# Patient Record
Sex: Male | Born: 1965 | Race: White | Hispanic: No | Marital: Married | Smoking: Never smoker
Health system: Southern US, Community
[De-identification: ages and names within clinical notes are randomized; demographics above are authoritative.]

## PROBLEM LIST (undated history)

## (undated) DIAGNOSIS — K219 Gastro-esophageal reflux disease without esophagitis: Secondary | ICD-10-CM

## (undated) HISTORY — PX: OTHER SURGICAL HISTORY: SHX169

## (undated) HISTORY — PX: APPENDECTOMY: SHX54

## (undated) HISTORY — PX: SHOULDER ARTHROSCOPY: SHX128

## (undated) HISTORY — DX: Gastro-esophageal reflux disease without esophagitis: K21.9

---

## 1999-03-16 ENCOUNTER — Ambulatory Visit (HOSPITAL_COMMUNITY): Admission: RE | Admit: 1999-03-16 | Discharge: 1999-03-16 | Payer: Self-pay | Admitting: Gastroenterology

## 2001-07-02 ENCOUNTER — Encounter: Admission: RE | Admit: 2001-07-02 | Discharge: 2001-07-02 | Payer: Self-pay | Admitting: Gastroenterology

## 2001-07-02 ENCOUNTER — Encounter: Payer: Self-pay | Admitting: Gastroenterology

## 2001-07-23 ENCOUNTER — Encounter (INDEPENDENT_AMBULATORY_CARE_PROVIDER_SITE_OTHER): Payer: Self-pay | Admitting: Specialist

## 2001-07-23 ENCOUNTER — Ambulatory Visit (HOSPITAL_COMMUNITY): Admission: RE | Admit: 2001-07-23 | Discharge: 2001-07-23 | Payer: Self-pay | Admitting: *Deleted

## 2001-11-23 ENCOUNTER — Observation Stay (HOSPITAL_COMMUNITY): Admission: EM | Admit: 2001-11-23 | Discharge: 2001-11-24 | Payer: Self-pay

## 2001-11-23 ENCOUNTER — Encounter (INDEPENDENT_AMBULATORY_CARE_PROVIDER_SITE_OTHER): Payer: Self-pay | Admitting: Specialist

## 2001-11-23 ENCOUNTER — Encounter: Payer: Self-pay | Admitting: Emergency Medicine

## 2004-06-15 ENCOUNTER — Ambulatory Visit: Payer: Self-pay | Admitting: Family Medicine

## 2007-09-08 ENCOUNTER — Emergency Department: Payer: Self-pay | Admitting: Unknown Physician Specialty

## 2007-09-10 ENCOUNTER — Ambulatory Visit (HOSPITAL_COMMUNITY): Admission: RE | Admit: 2007-09-10 | Discharge: 2007-09-11 | Payer: Self-pay | Admitting: Orthopedic Surgery

## 2010-02-11 ENCOUNTER — Emergency Department (HOSPITAL_COMMUNITY): Admission: EM | Admit: 2010-02-11 | Discharge: 2010-02-11 | Payer: Self-pay | Admitting: Family Medicine

## 2010-03-03 ENCOUNTER — Encounter: Admission: RE | Admit: 2010-03-03 | Discharge: 2010-03-03 | Payer: Self-pay | Admitting: Surgery

## 2010-03-24 ENCOUNTER — Ambulatory Visit (HOSPITAL_COMMUNITY): Admission: RE | Admit: 2010-03-24 | Discharge: 2010-03-24 | Payer: Self-pay | Admitting: Surgery

## 2010-10-01 LAB — CBC
HCT: 43.6 % (ref 39.0–52.0)
Hemoglobin: 15.2 g/dL (ref 13.0–17.0)
MCV: 90 fL (ref 78.0–100.0)
RDW: 12.3 % (ref 11.5–15.5)
WBC: 8.1 10*3/uL (ref 4.0–10.5)

## 2010-10-01 LAB — COMPREHENSIVE METABOLIC PANEL
ALT: 15 U/L (ref 0–53)
Alkaline Phosphatase: 60 U/L (ref 39–117)
BUN: 11 mg/dL (ref 6–23)
CO2: 25 mEq/L (ref 19–32)
Chloride: 105 mEq/L (ref 96–112)
GFR calc non Af Amer: >60 mL/min (ref >60)
Glucose, Bld: 107 mg/dL — ABNORMAL HIGH (ref 70–99)
Potassium: 3.7 mEq/L (ref 3.5–5.1)
Sodium: 139 mEq/L (ref 135–145)
Total Bilirubin: 0.5 mg/dL (ref 0.3–1.2)

## 2010-10-01 LAB — DIFFERENTIAL
Basophils Absolute: 0 10*3/uL (ref 0.0–0.1)
Basophils Relative: 1 % (ref 0–1)
Eosinophils Absolute: 0.1 10*3/uL (ref 0.0–0.7)
Monocytes Relative: 12 % (ref 3–12)
Neutro Abs: 5.1 10*3/uL (ref 1.7–7.7)
Neutrophils Relative %: 63 % (ref 43–77)

## 2010-11-08 ENCOUNTER — Ambulatory Visit (INDEPENDENT_AMBULATORY_CARE_PROVIDER_SITE_OTHER): Payer: BC Managed Care – PPO

## 2010-11-08 ENCOUNTER — Inpatient Hospital Stay (INDEPENDENT_AMBULATORY_CARE_PROVIDER_SITE_OTHER)
Admission: RE | Admit: 2010-11-08 | Discharge: 2010-11-08 | Disposition: A | Discharge status: Home / Self Care | Payer: BC Managed Care – PPO | Source: Ambulatory Visit | Attending: Family Medicine | Admitting: Family Medicine

## 2010-11-08 DIAGNOSIS — S9030XA Contusion of unspecified foot, initial encounter: Secondary | ICD-10-CM

## 2010-11-29 NOTE — Op Note (Signed)
Dakota [name], [name]              ACCOUNT NO.:  [id number]   MEDICAL RECORD NO.:  [id number]          PATIENT TYPE:  AMB   LOCATION:  SDS                          FACILITY:  [facility]   PHYSICIAN:  Burnard Bunting, M.D.    DATE OF BIRTH:  1965/08/17   DATE OF PROCEDURE:  09/10/2007  DATE OF DISCHARGE:                               OPERATIVE REPORT   PREOPERATIVE DIAGNOSIS:  Left grade V AC joint separation.   POSTOPERATIVE DIAGNOSIS:  Left grade V AC joint separation.   PROCEDURE:  Left grade V AC joint separation coracoclavicular ligament  reconstruction with palmaris longus autograft and graft rope from  Arthrex.   SURGEON:  Burnard Bunting, M.D.   ASSISTANT:  Jerolyn Shin. Tresa Res, M.D.   ANESTHESIA:  General endotracheal.   ESTIMATED BLOOD LOSS:  50 mL.   DRAINS:  None.   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  After explanation of  risks and benefits.   DESCRIPTION OF PROCEDURE:  The patient was brought to the operating room  where general endotracheal anesthesia was induced.  The patient was  initially supine on the operating table.  His right arm was prepped with  DuraPrep solution and draped in a sterile manner.  The tourniquet was  elevated for approximately 15 minutes at 250 mmHg.  A transverse  incision was made just proximal to the wrist flexion crease.  Palmaris  longus tendon was identified and dissected free from the surrounding  structures and transected.  Three transverse incisions were then made up  the forearm until the palmaris longus tendon was harvested.  The tendon  was then fixed to the graft rope.  It was funneled into thirds with two-  thirds on one end and one-third on the other end.  At this time the  tourniquet was released.  Bleeding points encountered were controlled  with electrocautery.  The forearm fascia  was closed with 3-0 Vicryl  suture and the skin was closed with 3-0 nylon suture.  Bulky soft  dressing was placed.  At this time the patient was then lifted upright  in the beach chair position with the head in a neutral position.  The  left arm, shoulder, and hand was prepped with DuraPrep solution and  draped in a sterile manner.  Topographic anatomy of the shoulder was  identified including the distal end of the clavicle which was visible as  a deformity through the skin as well as the coracoid process.  Longitudinal incision was made from the transverse process of the  coracoid up to the top of the clavicle.  Skin and subcutaneous tissue  were sharply divided.  The fascia was divided over the clavicle  longitudinally.  Muscles split bluntly down to the coracoid process.  The conoid tubercle was palpated and visualized.  The medial and lateral  superior aspect of the coracoid process was also visualized.  The  conjoin tendon was split longitudinally and blunt dissection was  used to  dissect underneath the coracoid tubercle.  At this time guide pin was  then drilled with the underlying neurovascular structures protected by  both my finger and the cotton elevator.  Guide pin was drilled  approximately 2 cm posterior to the anterior tip of the coracoid  process.  Wire loop was then passed in the 6 cm tunnel.  In a similar  fashion the clavicle was drilled approximately 40 mm medial to the  distal end of the clavicle.  This was done with a 6 mm drill.  At this  time the graft rope with the tight rope and the palmaris longus graft  was passed.  The endobutton was turned and that was confirmed in the AP  plane under fluoroscopy.  The clavicle was reduced.  The button was  tied.  Interference screw was placed into the tendon ends which came up  proximally through the clavicle.  Secure fixation was achieved.  A  thorough irrigation was performed.  The fascia was closed over the  clavicle  which was now reduced.  The bluntly divided muscle anteriorly  was also closed using #1 Vicryl suture.  Thorough irrigation was  performed prior to closure.  The skin was closed using interrupted  inverted 0 Vicryl suture, 2-0 Vicryl suture, and 3-0 Prolene.  Dressing  and sling were applied.  The patient tolerated the procedure well  without immediate complications.  Dr. Lenny Pastel help was required at  all times during this case because of the difficulty in guide pin  placement and retraction of neurovascular structures.  His assistance  was a medical necessity for safely performing this case.      Burnard Bunting, M.D.  Electronically Signed     GSD/MEDQ  D:  09/10/2007  T:  09/11/2007  Job:  [id number]

## 2010-12-02 NOTE — Procedures (Signed)
[facility]  Patient:    Dakota [name], Dakota [name] Visit Number: [id number] MRN: [id number]          Service Type: END Location: ENDO Attending Physician:  Sabino Gasser Dictated by:   Sabino Gasser, M.D. Admit Date:  07/23/2001   CC:         Anselmo Rod, M.D.                           Procedure Report  PROCEDURE:  Upper endoscopy with dilation and biopsy.  INDICATION:  Dysphagia with lower esophageal web.  ANESTHESIA:  Demerol 100 mg, Versed 10 mg.  DESCRIPTION OF PROCEDURE:  With the patient mildly sedated in the left lateral decubitus position, the Olympus videoscopic endoscope was inserted in the mouth, passed under direct vision through the esophagus, which appeared normal until we reached the distalmost portion, and a web was seen and there were also some changes of possible esophagitis noted and photograph taken.  We entered into the stomach and noted a hiatal hernia sac.  Fundus, body, antrum, duodenal bulb, second portion of duodenum all appeared relatively normal. From this point the endoscope was slowly withdrawn, taking circumferential views of the entire duodenal mucosa until the endoscope had been pulled back in the stomach, placed in retroflexion to view the stomach from below, and the hiatal hernia sac was once again seen from below.  The endoscope was then straightened and withdrawn, taking circumferential views of the remaining gastric and esophageal mucosa.  The endoscope was then reinserted distally to the stomach in the antrum and the guidewire was passed, the endoscope removed, and over the guidewire an 18 Savary dilator was passed easily.  With it the guidewire was removed.  The endoscope was reinserted, and the distalmost esophagus was where there were changes noted, and the possible esophagitis was biopsied.  The patients vital signs and pulse oximetry remained stable.  The patient tolerated the procedure well without apparent  complications.  FINDINGS: 1. 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 2. Esophagitis. 3. Hiatal hernia.  PLAN:  Await biopsy report.  The patient will call me for results and follow up with Dr. Loreta Ave as an outpatient. Dictated by:   Sabino Gasser, M.D. Attending Physician:  Sabino Gasser DD:  07/23/01 TD:  07/23/01 Job: [id number]

## 2010-12-02 NOTE — Op Note (Signed)
[place]  Patient:    Dakota [name], Dakota [name] Visit Number: [id number] MRN: [id number]          Service Type: SUR Location: [id number] Attending Physician:  Delsa Bern Dictated by:   Lorne Skeens. Hoxworth, M.D. Proc. Date: 11/23/01 Admit Date:  11/23/2001 Discharge Date: 11/24/2001                             Operative Report  PREOPERATIVE DIAGNOSIS:  Acute appendicitis.  POSTOPERATIVE DIAGNOSIS:  Acute appendicitis.  PROCEDURE:  Laparoscopic appendectomy.  SURGEON:  Lorne Skeens. Hoxworth, M.D.  ANESTHESIA:  General.  BRIEF HISTORY:  Dakota [name] is a 45 year old white male who presents with two days of gradually worsening right lower quadrant abdominal pain.  CT scan from the emergency room was consistent with early appendicitis.  Laparoscopic appendectomy has been recommended and accepted.  The nature of the procedure, its indications, and the risks of bleeding and infection were discussed and understood preoperatively.  He is now brought to the operating room for this procedure.  DESCRIPTION OF PROCEDURE:  The patient was brought to the operating room and placed in supine position on the operating table, and general endotracheal anesthesia was induced.  The Foley catheter was in place.  He received preoperative antibiotics.  The abdomen was sterilely prepped and draped. Local anesthesia was used to infiltrate the trocar sites.  A 1 cm incision was made at the umbilicus and dissection was carried down to the midline fascia, which was sharply incised for 1 cm and the peritoneum entered under direct vision.  Through a mattress suture of 0 Vicryl, the Hasson trocar was placed and pneumoperitoneum established.  Video laparoscopy showed the appendix to be lying anteriorly.  It was acutely inflamed without gangrene or perforation. Under direct vision a 5 mm trocar was placed in the right upper quadrant and 10/12 mm trocar in  the left lower quadrant.  The appendix was grasped and elevated and the base and mesoappendix exposed.  The appendix was inflamed down to near its base, but the base was soft.  The mesoappendix was dissected away from the appendix at its base.  It was divided with two firings of the Endo-GIA vascular stapler, completely freeing the appendix from the mesentery to the base.  Following this the appendix was divided from the cecum at its base with a firing of the 3.5 mm Endo-GIA stapler.  The appendix was placed in the Endocatch bag and brought out through the umbilicus.  The operative site was irrigated and complete hemostasis assured.  Trocars were removed under direct vision and all CO2 evacuated from the peritoneal cavity.  The mattress suture was secured to the umbilicus.  An additional mattress suture of 0 Vicryl was placed in the fascia in the left lower quadrant trocar site.  Skin incisions were closed with interrupted subcuticular 4-0 Monocryl and Steri-Strips.  Sponge and needle counts were correct.  Dry sterile dressings were applied.  The patient was taken to the recovery room in good condition. Dictated by:   Lorne Skeens. Hoxworth, M.D. Attending Physician:  Delsa Bern DD:  11/23/01 TD:  11/26/01 Job: [id number]

## 2011-04-07 LAB — BASIC METABOLIC PANEL
CO2: 28
Calcium: 9.1
Creatinine, Ser: 0.95
GFR calc Af Amer: >60
GFR calc non Af Amer: >60
Sodium: 139

## 2011-04-07 LAB — TYPE AND SCREEN: Antibody Screen: NEGATIVE

## 2011-04-07 LAB — CBC
MCHC: 34.1
RBC: 4.94
RDW: 12.6

## 2011-04-07 LAB — URINALYSIS, ROUTINE W REFLEX MICROSCOPIC
Bilirubin Urine: NEGATIVE
Hgb urine dipstick: NEGATIVE
Nitrite: NEGATIVE
Protein, ur: NEGATIVE
Urobilinogen, UA: 0.2

## 2011-04-07 LAB — DIFFERENTIAL
Basophils Absolute: 0
Basophils Relative: 1
Lymphocytes Relative: 24
Monocytes Relative: 8
Neutro Abs: 5.6
Neutrophils Relative %: 67

## 2011-04-07 LAB — ABO/RH: ABO/RH(D): O POS

## 2013-04-14 ENCOUNTER — Encounter (HOSPITAL_COMMUNITY): Payer: Self-pay | Admitting: Emergency Medicine

## 2013-04-14 ENCOUNTER — Emergency Department (HOSPITAL_COMMUNITY)
Admission: EM | Admit: 2013-04-14 | Discharge: 2013-04-14 | Disposition: A | Discharge status: Home / Self Care | Payer: 59 | Source: Home / Self Care | Attending: Emergency Medicine | Admitting: Emergency Medicine

## 2013-04-14 ENCOUNTER — Ambulatory Visit (HOSPITAL_COMMUNITY): Payer: 59 | Attending: Emergency Medicine

## 2013-04-14 DIAGNOSIS — S61409A Unspecified open wound of unspecified hand, initial encounter: Secondary | ICD-10-CM | POA: Insufficient documentation

## 2013-04-14 DIAGNOSIS — Z23 Encounter for immunization: Secondary | ICD-10-CM

## 2013-04-14 DIAGNOSIS — X58XXXA Exposure to other specified factors, initial encounter: Secondary | ICD-10-CM | POA: Insufficient documentation

## 2013-04-14 DIAGNOSIS — M7989 Other specified soft tissue disorders: Secondary | ICD-10-CM | POA: Insufficient documentation

## 2013-04-14 DIAGNOSIS — L089 Local infection of the skin and subcutaneous tissue, unspecified: Secondary | ICD-10-CM

## 2013-04-14 MED ORDER — CEPHALEXIN 500 MG PO CAPS: 500.0000 mg | ORAL_CAPSULE | Freq: Four times a day (QID) | ORAL | Status: DC

## 2013-04-14 MED ORDER — TETANUS-DIPHTH-ACELL PERTUSSIS 5-2.5-18.5 LF-MCG/0.5 IM SUSP
0.5000 mL | Freq: Once | INTRAMUSCULAR | Status: AC
  Administered 2013-04-14: 0.5 mL via INTRAMUSCULAR

## 2013-04-14 MED ORDER — TETANUS-DIPHTH-ACELL PERTUSSIS 5-2.5-18.5 LF-MCG/0.5 IM SUSP
INTRAMUSCULAR | Status: AC
  Filled 2013-04-14: qty 0.5

## 2013-04-14 NOTE — ED Notes (Signed)
Patient reports cutting right thumb on a piece of sheet metal on Friday-9/26.  Small, scabbed wound on right thumb.  Also noted swelling, pain and redness in thumb and index finger and area between the two locations

## 2013-04-14 NOTE — ED Notes (Signed)
To main xray

## 2013-04-14 NOTE — ED Provider Notes (Signed)
CSN: [id number]     Arrival date & time 04/14/13  1507 History   None    Chief Complaint  Patient presents with  . Wound Infection   (Consider location/radiation/quality/duration/timing/severity/associated sxs/prior Treatment) HPI Comments: Pt here because thumb and adjacent hand are now swelling, painful.   Patient is a 47 y.o. male presenting with hand injury. The history is provided by the patient.  Hand Injury Location:  Hand Time since incident:  3 days Injury: yes   Mechanism of injury comment:  Cut R thumb on a piece of sheet metal Hand location:  R hand Pain details:    Quality:  Aching and pressure   Radiates to: radiates from R thumb at site of wound to thumb DIP and MCP of index finger.   Severity:  Moderate   Onset quality:  Gradual   Duration:  3 days   Timing:  Constant   Progression:  Worsening Chronicity:  New Foreign body present:  No foreign bodies Tetanus status:  Out of date Relieved by:  Nothing Worsened by:  Nothing tried Ineffective treatments:  None tried Associated symptoms: no fever, no muscle weakness, no numbness and no tingling     History reviewed. No pertinent past medical history. Past Surgical History  Procedure Laterality Date  . Finger reattachment Left   . Shoulder arthroscopy Left    No family history on file. History  Substance Use Topics  . Smoking status: Never Smoker   . Smokeless tobacco: Not on file  . Alcohol Use: No    Review of Systems  Constitutional: Negative for fever and chills.  Skin: Positive for wound.    Allergies  Review of patient's allergies indicates no known allergies.  Home Medications   Current Outpatient Rx  Name  Route  Sig  Dispense  Refill  . cephALEXin (KEFLEX) 500 MG capsule   Oral   Take 1 capsule (500 mg total) by mouth 4 (four) times daily.   40 capsule   0    BP 108/55  Pulse 60  Temp(Src) 98 F (36.7 C) (Oral)  Resp 16  SpO2 100% Physical Exam  Constitutional: He appears  well-developed and well-nourished. No distress.  Musculoskeletal:       Right hand: He exhibits tenderness and swelling. He exhibits normal range of motion. Normal strength noted.       Hands: Tender to palp in thumb, thenar area of R hand and MCP of index finger  Skin: Skin is warm and dry.  See MSK exam    ED Course  Procedures (including critical care time) Labs Review Labs Reviewed - No data to display Imaging Review Dg Hand Complete Right  04/14/2013   CLINICAL DATA:  Laceration.  EXAM: RIGHT HAND - COMPLETE 3+ VIEW  COMPARISON:  None.  FINDINGS: The joint spaces are maintained. No acute fracture or radiopaque foreign body. Soft tissue swelling noted over the dorsum of the hand at the metacarpal head level  IMPRESSION: No fracture or radiopaque foreign body.   Electronically Signed   By: Loralie Champagne M.D.   On: 04/14/2013 18:03    MDM   1. Laceration of hand with infection, right, initial encounter    rx keflex 500mg  QID for 10 days. Referred to [facility] for f/u; pt previously seen at [facility] ortho, will try for f/u there first.     Cathlyn Parsons, NP 04/14/13 1836

## 2013-04-14 NOTE — ED Notes (Signed)
Patient returned from radiology, patient not ready for discharge.  Patient expecting antibiotics

## 2013-04-14 NOTE — ED Notes (Signed)
Tetanus 5-10 years ago

## 2013-04-15 NOTE — ED Provider Notes (Signed)
Medical screening examination/treatment/procedure(s) were performed by resident physician or non-physician practitioner and as supervising physician I was immediately available for consultation/collaboration.   Barkley Bruns MD.   Linna Hoff, MD 04/15/13 [phone]

## 2016-05-29 ENCOUNTER — Ambulatory Visit (HOSPITAL_COMMUNITY)
Admission: EM | Admit: 2016-05-29 | Discharge: 2016-05-29 | Disposition: A | Discharge status: Home / Self Care | Payer: 59 | Attending: Emergency Medicine | Admitting: Emergency Medicine

## 2016-05-29 ENCOUNTER — Encounter (HOSPITAL_COMMUNITY): Payer: Self-pay | Admitting: Emergency Medicine

## 2016-05-29 DIAGNOSIS — L729 Follicular cyst of the skin and subcutaneous tissue, unspecified: Secondary | ICD-10-CM

## 2016-05-29 MED ORDER — MUPIROCIN 2 % EX OINT: 1.0000 "application " | TOPICAL_OINTMENT | Freq: Two times a day (BID) | CUTANEOUS | 0 refills | Status: DC

## 2016-05-29 NOTE — Discharge Instructions (Signed)
You have a cyst on your scalp. We poked a hole in it. Do not pick at it. There is a good chance this will come back. Wash the area with soap and water twice a day and apply mupirocin ointment. Follow-up as needed.

## 2016-05-29 NOTE — ED Triage Notes (Signed)
Pt has a small abscess on the left side of the back of his head.  It is red and sore.  Pt states it is getting bigger over the last month.  He denies any fever.

## 2016-05-29 NOTE — ED Provider Notes (Signed)
[place]    CSN: [id number] Arrival date & time: 05/29/16  1251     History   Chief Complaint Chief Complaint  Patient presents with  . Abscess    HPI Dakota [name] is a 50 y.o. male.   HPI Patient is a 50 y.o. Male who presents to UC c/o abscess on left side of posterior scalp x 3.5 weeks.  Notes that it has been increasing in size, is red and itchy, only mildly tender. Wife has tried to pop without success.  Has never had anything like this in the past.  Denies fever, chills, abdominal pain, N/V/D.    History reviewed. No pertinent past medical history.  There are no active problems to display for this patient.   Past Surgical History:  Procedure Laterality Date  . finger reattachment Left   . SHOULDER ARTHROSCOPY Left        Home Medications    Prior to Admission medications   Medication Sig Start Date End Date Taking? Authorizing Provider  mupirocin ointment (BACTROBAN) 2 % Apply 1 application topically 2 (two) times daily. For 5 days 05/29/16   Melony Overly, MD    Family History History reviewed. No pertinent family history.  Social History Social History  Substance Use Topics  . Smoking status: Never Smoker  . Smokeless tobacco: Never Used  . Alcohol use No     Allergies   Patient has no known allergies.   Review of Systems Review of Systems  Constitutional: Negative for activity change, chills, fatigue and fever.  Skin: Positive for color change.       Erythematous abscess on left side of scalp  All other systems reviewed and are negative.    Physical Exam Triage Vital Signs ED Triage Vitals [05/29/16 1416]  Enc Vitals Group     BP 130/83     Pulse Rate 85     Resp      Temp 98 F (36.7 C)     Temp Source Oral     SpO2 95 %     Weight      Height      Head Circumference      Peak Flow      Pain Score 0     Pain Loc      Pain Edu?      Excl. in [place]?    No data found.   Updated Vital Signs BP 130/83 (BP  Location: Right Arm)   Pulse 85   Temp 98 F (36.7 C) (Oral)   SpO2 95%    Physical Exam  Constitutional: He is oriented to person, place, and time. He appears well-developed and well-nourished. No distress.  HENT:  Head: Normocephalic and atraumatic.  Eyes: EOM are normal.  Neck: Normal range of motion.  Cardiovascular: Normal rate and regular rhythm.   Pulmonary/Chest: Effort normal. No respiratory distress.  Musculoskeletal: Normal range of motion.  Neurological: He is alert and oriented to person, place, and time.  Skin: Skin is warm and dry. He is not diaphoretic.     1 cm x 1 cm erythematous cyst on left side posterior scalp. No warmth, drainage, or pus. No tenderness to palpation.  Psychiatric: He has a normal mood and affect. His behavior is normal. Judgment and thought content normal.  Nursing note and vitals reviewed.    UC Treatments / Results  Labs (all labs ordered are listed, but only abnormal results are displayed) Labs Reviewed - No data  to display  EKG  EKG Interpretation None       Radiology No results found.  Procedures .Marland KitchenIncision and Drainage Date/Time: 05/29/2016 3:03 PM Performed by: Melony Overly Authorized by: Melony Overly   Consent:    Consent obtained:  Verbal   Consent given by:  Patient   Alternatives discussed:  Alternative treatment Location:    Type:  Cyst   Size:  1 cm   Location:  Head   Head location:  Scalp Anesthesia (see MAR for exact dosages):    Anesthesia method:  Local infiltration   Local anesthetic:  Lidocaine 2% WITH epi Procedure type:    Complexity:  Simple Procedure details:    Needle aspiration: no     Incision types:  Single straight   Incision depth:  Dermal   Scalpel blade:  11   Wound management:  Probed and deloculated   Drainage:  Serosanguinous   Drainage amount:  Scant   Wound treatment:  Wound left open   Packing materials:  None Post-procedure details:    Patient tolerance of procedure:   Tolerated well, no immediate complications    (including critical care time)  Medications Ordered in UC Medications - No data to display   Initial Impression / Assessment and Plan / UC Course  I have reviewed the triage vital signs and the nursing notes.  Pertinent labs & imaging results that were available during my care of the patient were reviewed by me and considered in my medical decision making (see chart for details).  Clinical Course   Patient presented c/o abscess on posterior left scalp x 3.5 weeks.   Cystic in appearance.  Gave patient the option of antibiotic treatment v. I&D. Patient preferred I&D. With straight incision, was able to get scant fluid out. Advised that there is a good chance that the cyst will come back.  Left incision open to heal.  Mupirocin ointment 2% bid x 5 days prescribed at today's visit.  Advised not to pick at the cyst, wash with soap and water twice daily and apply mupirocin ointment.  Follow up as needed.  Patient seen and examined with PA student.  Note is reviewed and edited as necessary.  I personally performed the I&D.  Final Clinical Impressions(s) / UC Diagnoses   Final diagnoses:  Scalp cyst    New Prescriptions New Prescriptions   MUPIROCIN OINTMENT (BACTROBAN) 2 %    Apply 1 application topically 2 (two) times daily. For 5 days     Melony Overly, MD 05/29/16 2121

## 2016-07-27 ENCOUNTER — Encounter (HOSPITAL_COMMUNITY): Payer: Self-pay | Admitting: Emergency Medicine

## 2016-07-27 ENCOUNTER — Ambulatory Visit (HOSPITAL_COMMUNITY)
Admission: EM | Admit: 2016-07-27 | Discharge: 2016-07-27 | Disposition: A | Discharge status: Home / Self Care | Payer: 59 | Attending: Emergency Medicine | Admitting: Emergency Medicine

## 2016-07-27 DIAGNOSIS — D2339 Other benign neoplasm of skin of other parts of face: Secondary | ICD-10-CM | POA: Diagnosis not present

## 2016-07-27 MED ORDER — MUPIROCIN 2 % EX OINT: 1.0000 "application " | TOPICAL_OINTMENT | Freq: Two times a day (BID) | CUTANEOUS | 0 refills | Status: DC

## 2016-07-27 NOTE — Discharge Instructions (Signed)
This is likely something called a dermoid cyst. Please call Dr. Allyson Sabal, a dermatologist. He can remove it for you. Use the mupirocin ointment twice a day for the next couple of days.

## 2016-07-27 NOTE — ED Triage Notes (Signed)
Here for abscess onset 2 weeks on forehead  Reports he tried to lancet it w/no success  Sx include: pain   A&O x4... NAD

## 2016-07-27 NOTE — ED Provider Notes (Signed)
[place]    CSN: [id number] Arrival date & time: 07/27/16  1421     History   Chief Complaint Chief Complaint  Patient presents with  . Abscess    HPI Dakota [name] is a 51 y.o. male.   HPI  He is a 51 year old man here for possible abscess on his forehead. It started about 4 days ago. It has gradually been getting larger. His wife tried to drain it with a needle without success. It is uncomfortable. No fevers.  History reviewed. No pertinent past medical history.  There are no active problems to display for this patient.   Past Surgical History:  Procedure Laterality Date  . finger reattachment Left   . SHOULDER ARTHROSCOPY Left        Home Medications    Prior to Admission medications   Medication Sig Start Date End Date Taking? Authorizing Provider  mupirocin ointment (BACTROBAN) 2 % Apply 1 application topically 2 (two) times daily. For 5 days 07/27/16   Melony Overly, MD    Family History History reviewed. No pertinent family history.  Social History Social History  Substance Use Topics  . Smoking status: Never Smoker  . Smokeless tobacco: Never Used  . Alcohol use No     Allergies   Patient has no known allergies.   Review of Systems Review of Systems As in history of present illness  Physical Exam Triage Vital Signs ED Triage Vitals [07/27/16 1441]  Enc Vitals Group     BP 126/70     Pulse Rate 79     Resp 16     Temp 98.1 F (36.7 C)     Temp Source Oral     SpO2 98 %     Weight      Height      Head Circumference      Peak Flow      Pain Score 6     Pain Loc      Pain Edu?      Excl. in [place]?    No data found.   Updated Vital Signs BP 126/70 (BP Location: Left Arm)   Pulse 79   Temp 98.1 F (36.7 C) (Oral)   Resp 16   SpO2 98%   Visual Acuity Right Eye Distance:   Left Eye Distance:   Bilateral Distance:    Right Eye Near:   Left Eye Near:    Bilateral Near:     Physical Exam  Constitutional:  He is oriented to person, place, and time. He appears well-developed and well-nourished. No distress.  Cardiovascular: Normal rate.   Pulmonary/Chest: Effort normal.  Neurological: He is alert and oriented to person, place, and time.  Skin:  1 cm well-defined non-tender cystic nodule on the forehead     UC Treatments / Results  Labs (all labs ordered are listed, but only abnormal results are displayed) Labs Reviewed - No data to display  EKG  EKG Interpretation None       Radiology No results found.  Procedures .Marland KitchenIncision and Drainage Date/Time: 07/27/2016 3:42 PM Performed by: Melony Overly Authorized by: Melony Overly   Consent:    Consent obtained:  Verbal   Consent given by:  Patient   Alternatives discussed:  No treatment Location:    Type:  Cyst   Size:  1cm   Location:  Head   Head/neck location: Forehead. Pre-procedure details:    Procedure prep: Alcohol. Anesthesia (see MAR for  exact dosages):    Anesthesia method:  Local infiltration   Local anesthetic:  Lidocaine 2% WITH epi Post-procedure details:    Patient tolerance of procedure:  Tolerated well, no immediate complications Comments:     An incision was made across the nodule. This appears to be a solid nodule. There was no drainage.   (including critical care time)  Medications Ordered in UC Medications - No data to display   Initial Impression / Assessment and Plan / UC Course  I have reviewed the triage vital signs and the nursing notes.  Pertinent labs & imaging results that were available during my care of the patient were reviewed by me and considered in my medical decision making (see chart for details).  Clinical Course     This appears to be a dermoid cyst. Recommended follow-up with dermatology for removal. Mupirocin ointment twice a day for the next few days.  Final Clinical Impressions(s) / UC Diagnoses   Final diagnoses:  Dermoid cyst of forehead    New  Prescriptions Current Discharge Medication List       Melony Overly, MD 07/27/16 1544

## 2016-08-21 ENCOUNTER — Other Ambulatory Visit: Payer: Self-pay | Admitting: Otolaryngology

## 2017-04-19 ENCOUNTER — Encounter (INDEPENDENT_AMBULATORY_CARE_PROVIDER_SITE_OTHER): Payer: Self-pay | Admitting: Orthopedic Surgery

## 2017-04-19 ENCOUNTER — Ambulatory Visit (INDEPENDENT_AMBULATORY_CARE_PROVIDER_SITE_OTHER): Payer: 59 | Admitting: Orthopedic Surgery

## 2017-04-19 ENCOUNTER — Ambulatory Visit (INDEPENDENT_AMBULATORY_CARE_PROVIDER_SITE_OTHER): Payer: 59

## 2017-04-19 DIAGNOSIS — M25511 Pain in right shoulder: Secondary | ICD-10-CM

## 2017-04-19 DIAGNOSIS — M25512 Pain in left shoulder: Secondary | ICD-10-CM | POA: Diagnosis not present

## 2017-04-19 DIAGNOSIS — G8929 Other chronic pain: Secondary | ICD-10-CM | POA: Diagnosis not present

## 2017-04-19 MED ORDER — MELOXICAM 15 MG PO TABS: ORAL_TABLET | ORAL | 0 refills | Status: DC

## 2017-04-23 NOTE — Progress Notes (Signed)
Office Visit Note   Patient: Dakota [name]           Date of Birth: 1965/12/31           MRN: [id number] Visit Date: 04/19/2017 Requested by: No referring provider defined for this encounter. PCP: Patient, No Pcp Per  Subjective: Chief Complaint  Patient presents with  . Shoulder Pain    bilateral L>R    HPI: Dakota [name] is a 51 year old patient with bilateral shoulder pain left worse than right.  Had grade 5 acromioclavicular joint stabilization 9 years ago.  Reports general's left-sided shoulder pain.  Hurts for him in the deltoid region.  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  Denies any new mechanical symptoms.  He is still working.  Reports similar but less severe right-sided shoulder symptoms.  Denies any neck pain or numbness and tingling.  The pain will wake him from sleep at night.              ROS: All systems reviewed are negative as they relate to the chief complaint within the history of present illness.  Patient denies  fevers or chills.   Assessment & Plan: Visit Diagnoses:  1. Chronic pain of both shoulders     Plan: Impression is bilateral shoulder pain left worse than right plan is ultrasound examination with the large probe and rotator cuff appears to be intact.  We'll try him on Modic for 3 weeks.  4 week return to decide then for or against injection or further imaging.  I also want to compare his left-sided chest x-ray to previous chest x-rays regarding opacity around the seventh and eighth rib on the left-hand side  Follow-Up Instructions: Return in about 4 weeks (around 05/17/2017).   Orders:  Orders Placed This Encounter  Procedures  . XR Shoulder Left  . XR Shoulder Right   Meds ordered this encounter  Medications  . meloxicam (MOBIC) 15 MG tablet    Sig: 1 po q d x 3 weeks    Dispense:  30 tablet    Refill:  0      Procedures: No procedures performed   Clinical Data: No additional  findings.  Objective: Vital Signs: There were no vitals taken for this visit.  Physical Exam:   Constitutional: Patient appears well-developed HEENT:  Head: Normocephalic Eyes:EOM are normal Neck: Normal range of motion Cardiovascular: Normal rate Pulmonary/chest: Effort normal Neurologic: Patient is alert Skin: Skin is warm Psychiatric: Patient has normal mood and affect    Ortho Exam: Orthopedic exam demonstrates well-healed surgical incision left shoulder.  Shoulder girdle with appears symmetric.  Rotator cuff strength intact bilaterally to infraspinatus super space and subscap muscle testing.  No other masses lymph adenopathy or skin changes noted in the shoulder region.  Negative apprehension relocation testing on the left shoulder.  Impingement signs equivocal bilaterally.  O'Brien's testing negative bilaterally.  Specialty Comments:  No specialty comments available.  Imaging: No results found.   PMFS History: There are no active problems to display for this patient.  No past medical history on file.  No family history on file.  Past Surgical History:  Procedure Laterality Date  . finger reattachment Left   . SHOULDER ARTHROSCOPY Left    Social History   Occupational History  . Not on file.   Social History Main Topics  . Smoking status: Never Smoker  . Smokeless tobacco: Never Used  . Alcohol use No  .  Drug use: No  . Sexual activity: Not on file       

## 2017-05-16 ENCOUNTER — Other Ambulatory Visit (INDEPENDENT_AMBULATORY_CARE_PROVIDER_SITE_OTHER): Payer: Self-pay | Admitting: Orthopedic Surgery

## 2017-05-16 NOTE — Telephone Encounter (Signed)
Ok to rf? 

## 2017-05-16 NOTE — Telephone Encounter (Signed)
y

## 2017-05-23 ENCOUNTER — Ambulatory Visit (INDEPENDENT_AMBULATORY_CARE_PROVIDER_SITE_OTHER): Payer: 59 | Admitting: Orthopedic Surgery

## 2017-05-23 ENCOUNTER — Encounter (INDEPENDENT_AMBULATORY_CARE_PROVIDER_SITE_OTHER): Payer: Self-pay | Admitting: Orthopedic Surgery

## 2017-05-23 DIAGNOSIS — M25512 Pain in left shoulder: Secondary | ICD-10-CM | POA: Diagnosis not present

## 2017-05-23 DIAGNOSIS — G8929 Other chronic pain: Secondary | ICD-10-CM | POA: Diagnosis not present

## 2017-05-23 MED ORDER — MELOXICAM 15 MG PO TABS: ORAL_TABLET | ORAL | 0 refills | Status: DC

## 2017-05-23 NOTE — Progress Notes (Signed)
Office Visit Note   Patient: Dakota [name]           Date of Birth: 1965-10-21           MRN: [id number] Visit Date: 05/23/2017 Requested by: No referring provider defined for this encounter. PCP: Patient, No Pcp Per  Subjective: Chief Complaint  Patient presents with  . Shoulder Pain    follow up    HPI: Dakota [name] is a 51 year old patient with bilateral shoulder pain left worse than right.  He has been taking mobile but the pain returned after he stopped taking it.  Reports a general constant burning pain in the deltoid region.  He has had acromioclavicular joint reconstruction years ago done on that left side.  Whenever he has any type of superior pressure sending the humeral head superiorly he reports sharp pain.  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              ROS: All systems reviewed are negative as they relate to the chief complaint within the history of present illness.  Patient denies  fevers or chills.   Assessment & Plan: Visit Diagnoses:  1. Chronic left shoulder pain     Plan: Impression is left shoulder pain refractory to nonoperative management with possibility of rotator cuff pathology or labral pathology based on heavy duty work requirements and current symptoms and exam.  Favor MRI arthrogram to evaluate rotator cuff tear and superior labrum.  We will refill the low back today.  Continue with avoiding heavy overhead lifting.  I'll see him back after his MRI scan  Follow-Up Instructions: Return for after MRI.   Orders:  Orders Placed This Encounter  Procedures  . MR Shoulder Left w/ contrast  . Arthrogram   Meds ordered this encounter  Medications  . meloxicam (MOBIC) 15 MG tablet    Sig: TAKE 1 TABLET BY MOUTH ONCE DAILY X 3 WEEKS    Dispense:  30 tablet    Refill:  0      Procedures: No procedures performed   Clinical Data: No additional findings.  Objective: Vital Signs: There were no vitals taken for this  visit.  Physical Exam:   Constitutional: Patient appears well-developed HEENT:  Head: Normocephalic Eyes:EOM are normal Neck: Normal range of motion Cardiovascular: Normal rate Pulmonary/chest: Effort normal Neurologic: Patient is alert Skin: Skin is warm Psychiatric: Patient has normal mood and affect    Ortho Exam: Orthopedic exam demonstrates full active and passive range of motion of the right shoulder on the left-hand side he has pain with resisted supraspinatus testing but good external rotation strength.  Subscap strength also intact.  Clavicle appears to be normally located in relation to the acromion on both sides.  He does have some pain with internal/external rotation on the left-hand side positive O'Brien's testing on the left.  No restriction of passive range of motion on the left shoulder.  Specialty Comments:  No specialty comments available.  Imaging: No results found.   PMFS History: There are no active problems to display for this patient.  History reviewed. No pertinent past medical history.  History reviewed. No pertinent family history.  Past Surgical History:  Procedure Laterality Date  . finger reattachment Left   . SHOULDER ARTHROSCOPY Left    Social History   Occupational History  . Not on file  Tobacco Use  . Smoking status: Never Smoker  . Smokeless tobacco: Never Used  Substance and Sexual Activity  .  Alcohol use: No  . Drug use: No  . Sexual activity: Not on file

## 2017-06-12 ENCOUNTER — Ambulatory Visit
Admission: RE | Admit: 2017-06-12 | Discharge: 2017-06-12 | Disposition: A | Discharge status: Home / Self Care | Payer: 59 | Source: Ambulatory Visit | Attending: Orthopedic Surgery | Admitting: Orthopedic Surgery

## 2017-06-12 DIAGNOSIS — M25512 Pain in left shoulder: Principal | ICD-10-CM

## 2017-06-12 DIAGNOSIS — G8929 Other chronic pain: Secondary | ICD-10-CM

## 2017-06-12 MED ORDER — IOPAMIDOL (ISOVUE-M 200) INJECTION 41%
20.0000 mL | Freq: Once | INTRAMUSCULAR | Status: AC
  Administered 2017-06-12: 20 mL via INTRA_ARTICULAR

## 2017-06-14 ENCOUNTER — Ambulatory Visit (INDEPENDENT_AMBULATORY_CARE_PROVIDER_SITE_OTHER): Payer: 59 | Admitting: Orthopedic Surgery

## 2017-06-14 DIAGNOSIS — M75122 Complete rotator cuff tear or rupture of left shoulder, not specified as traumatic: Secondary | ICD-10-CM

## 2017-06-17 ENCOUNTER — Encounter (INDEPENDENT_AMBULATORY_CARE_PROVIDER_SITE_OTHER): Payer: Self-pay | Admitting: Orthopedic Surgery

## 2017-06-17 NOTE — Progress Notes (Signed)
   Office Visit Note   Patient: Dakota [name]           Date of Birth: 16-Dec-1965           MRN: [id number] Visit Date: 06/14/2017 Requested by: No referring provider defined for this encounter. PCP: Patient, No Pcp Per  Subjective: Chief Complaint  Patient presents with  . 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  Since I have seen him he has had an MRI scan which shows rotator cuff tear.  The scan is reviewed with the patient.  He is having some symptoms.  Hair is medium size and not retracted.  The biceps tendon may or may not be he is having continued pain and symptoms with did cavities which involved strength.              ROS: All systems reviewed are negative as they relate to the chief complaint within the history of present illness.  Patient denies  fevers or chills.   Assessment & Plan: Visit Diagnoses:  1. Complete tear of left rotator cuff     Plan: Impression is left shoulder rotator cuff tear.  Plan rotator cuff repair and possible biceps tenodesis.  In general the biceps tendon looks intact but occult damage may be present which would necessitate tenodesis.  Anticipate 2-3 months out of work of heavy lifting with that shoulder rotator cuff repair.  The risk and benefits are discussed including but not limited to infection nerve vessel damage healing as well as need for more surgery.  Plan to use postop CPM machine.  All questions answered.  Follow-Up Instructions: No Follow-up on file.   Orders:  No orders of the defined types were placed in this encounter.  No orders of the defined types were placed in this encounter.     Procedures: No procedures performed   Clinical Data: No additional findings.  Objective: Vital Signs: There were no vitals taken for this visit.  Physical Exam:   Constitutional: Patient appears well-developed HEENT:  Head: Normocephalic Eyes:EOM are normal Neck:  Normal range of motion Cardiovascular: Normal rate Pulmonary/chest: Effort normal Neurologic: Patient is alert Skin: Skin is warm Psychiatric: Patient has normal mood and affect    Ortho Exam: Orthopedic exam demonstrates full active and passive range of motion of that left shoulder with some rotator cuff weakness to infraspinatus and supraspinatus testing.  O'Brien's testing is negative.  No discrete acromioclavicular joint tenderness is present on the left or right shoulder.  No restriction of passive range of motion of that left shoulder.  Specialty Comments:  No specialty comments available.  Imaging: No results found.   PMFS History: There are no active problems to display for this patient.  History reviewed. No pertinent past medical history.  History reviewed. No pertinent family history.  Past Surgical History:  Procedure Laterality Date  . finger reattachment Left   . SHOULDER ARTHROSCOPY Left    Social History   Occupational History  . Not on file  Tobacco Use  . Smoking status: Never Smoker  . Smokeless tobacco: Never Used  Substance and Sexual Activity  . Alcohol use: No  . Drug use: No  . Sexual activity: Not on file

## 2017-06-25 ENCOUNTER — Ambulatory Visit: Payer: 59 | Admitting: Family Medicine

## 2017-08-19 ENCOUNTER — Other Ambulatory Visit (INDEPENDENT_AMBULATORY_CARE_PROVIDER_SITE_OTHER): Payer: Self-pay | Admitting: Orthopedic Surgery

## 2017-08-20 NOTE — Telephone Encounter (Signed)
Ok to rf? 

## 2017-08-20 NOTE — Telephone Encounter (Signed)
y

## 2019-05-12 ENCOUNTER — Other Ambulatory Visit: Payer: Self-pay

## 2019-05-12 DIAGNOSIS — Z20822 Contact with and (suspected) exposure to covid-19: Secondary | ICD-10-CM

## 2019-05-13 LAB — NOVEL CORONAVIRUS, NAA: SARS-CoV-2, NAA: NOT DETECTED

## 2019-06-05 ENCOUNTER — Other Ambulatory Visit: Payer: Self-pay

## 2019-06-05 DIAGNOSIS — Z20822 Contact with and (suspected) exposure to covid-19: Secondary | ICD-10-CM

## 2019-06-08 LAB — NOVEL CORONAVIRUS, NAA: SARS-CoV-2, NAA: NOT DETECTED

## 2021-07-20 ENCOUNTER — Ambulatory Visit: Payer: Self-pay

## 2021-11-08 ENCOUNTER — Ambulatory Visit: Payer: 59 | Admitting: Physician Assistant

## 2021-11-08 ENCOUNTER — Ambulatory Visit (INDEPENDENT_AMBULATORY_CARE_PROVIDER_SITE_OTHER): Payer: 59

## 2021-11-08 ENCOUNTER — Encounter: Payer: Self-pay | Admitting: Physician Assistant

## 2021-11-08 VITALS — BP 128/81 | HR 79 | Temp 98.6°F | Resp 18 | Ht 70.0 in | Wt 223.0 lb

## 2021-11-08 DIAGNOSIS — J90 Pleural effusion, not elsewhere classified: Secondary | ICD-10-CM | POA: Diagnosis not present

## 2021-11-08 DIAGNOSIS — E6609 Other obesity due to excess calories: Secondary | ICD-10-CM | POA: Insufficient documentation

## 2021-11-08 DIAGNOSIS — Z125 Encounter for screening for malignant neoplasm of prostate: Secondary | ICD-10-CM

## 2021-11-08 DIAGNOSIS — B001 Herpesviral vesicular dermatitis: Secondary | ICD-10-CM

## 2021-11-08 DIAGNOSIS — Z114 Encounter for screening for human immunodeficiency virus [HIV]: Secondary | ICD-10-CM

## 2021-11-08 DIAGNOSIS — K219 Gastro-esophageal reflux disease without esophagitis: Secondary | ICD-10-CM | POA: Insufficient documentation

## 2021-11-08 DIAGNOSIS — Z1159 Encounter for screening for other viral diseases: Secondary | ICD-10-CM

## 2021-11-08 DIAGNOSIS — E66811 Obesity, class 1: Secondary | ICD-10-CM

## 2021-11-08 DIAGNOSIS — Z6832 Body mass index (BMI) 32.0-32.9, adult: Secondary | ICD-10-CM

## 2021-11-08 MED ORDER — PANTOPRAZOLE SODIUM 40 MG PO TBEC: 40.0000 mg | DELAYED_RELEASE_TABLET | Freq: Every day | ORAL | 2 refills | Status: DC

## 2021-11-08 NOTE — Progress Notes (Signed)
? ?New Patient Office Visit ? ?Subjective   ? ?Patient ID: Dakota [name], male    DOB: [birth year]  Age: 56 y.o. MRN: [id number] ? ?CC:  ?Chief Complaint  ?Patient presents with  ? Referral  ?  Pulmonology  ? ? ?HPI ?Dakota [name] states that he was having lower left back pain and though it may have been kidney stones, does endorse history of that.  States that he went to go see urology, had a CT to rule out kidney stones and learned that he had a pulmonary effusion.  CT report was brought in by patient today  ? ?Impression: ?1.  Tiny nonobstructing lower left renal stone.  No hydronephrosis.  No ureteral or bladder stones. ?2.  Small dependent left pleural effusion with mild to moderate compressive atelectasis in the dependent left lower lobe.  From 10/27/2021 ? ?States today that he does still feel some discomfort in his lower left back, states it is not constant, states that he does feel difficulty breathing when the pain is present.  Does endorse that son was sick with pneumonia recently ? ?States that he has been sleeping in a recliner for the last year due to history of heartburn and acid reflux.  States that he went to the beach last week and slept flat, states that this did help his left lower back pain. ? ?States that it did make his heartburn and acid reflux worse.  States that he takes Tums and omeprazole, but not on a daily basis. ? ?Does state that he was sunburned while at the beach, states that he started getting a cold sore on his lip from the sunburn, has been using over-the-counter cold sore cream with relief. ? ?Denies smoking history ? ? ?Outpatient Encounter Medications as of 11/08/2021  ?Medication Sig  ? pantoprazole (PROTONIX) 40 MG tablet Take 1 tablet (40 mg total) by mouth daily.  ? [DISCONTINUED] meloxicam (MOBIC) 15 MG tablet TAKE 1 TABLET BY MOUTH ONCE DAILY X 3 WEEKS  ? [DISCONTINUED] meloxicam (MOBIC) 15 MG tablet TAKE 1 TABLET BY MOUTH ONCE DAILY X 3 WEEKS  ? [DISCONTINUED]  mupirocin ointment (BACTROBAN) 2 % Apply 1 application topically 2 (two) times daily. For 5 days (Patient not taking: Reported on 04/19/2017)  ? ?No facility-administered encounter medications on file as of 11/08/2021.  ? ? ?History reviewed. No pertinent past medical history. ? ?Past Surgical History:  ?Procedure Laterality Date  ? finger reattachment Left   ? SHOULDER ARTHROSCOPY Left   ? ? ?History reviewed. No pertinent family history. ? ?Social History  ? ?Socioeconomic History  ? Marital status: Married  ?  Spouse name: Not on file  ? Number of children: Not on file  ? Years of education: Not on file  ? Highest education level: Not on file  ?Occupational History  ? Not on file  ?Tobacco Use  ? Smoking status: Never  ? Smokeless tobacco: Never  ?Substance and Sexual Activity  ? Alcohol use: No  ? Drug use: No  ? Sexual activity: Yes  ?Other Topics Concern  ? Not on file  ?Social History Narrative  ? Not on file  ? ?Social Determinants of Health  ? ?Financial Resource Strain: Not on file  ?Food Insecurity: Not on file  ?Transportation Needs: Not on file  ?Physical Activity: Not on file  ?Stress: Not on file  ?Social Connections: Not on file  ?Intimate Partner Violence: Not on file  ? ? ?Review of Systems  ?Constitutional: Negative.   ?  HENT: Negative.    ?Eyes: Negative.   ?Respiratory:  Negative for shortness of breath.   ?Cardiovascular:  Negative for chest pain.  ?Gastrointestinal: Negative.   ?Genitourinary: Negative.   ?Musculoskeletal: Negative.   ?Skin: Negative.   ?Neurological: Negative.   ?Endo/Heme/Allergies: Negative.   ?Psychiatric/Behavioral: Negative.    ? ?  ? ? ?Objective   ? ?BP 128/81 (BP Location: Left Arm, Patient Position: Sitting, Cuff Size: Normal)   Pulse 79   Temp 98.6 ?F (37 ?C) (Oral)   Resp 18   Ht 5\' 10"  (1.778 m)   Wt 223 lb (101.2 kg)   SpO2 95%   BMI 32.00 kg/m?  ? ?Physical Exam ?Vitals and nursing note reviewed.  ?Constitutional:   ?   Appearance: Normal appearance.  ?HENT:   ?   Head: Normocephalic and atraumatic.  ?   Right Ear: External ear normal.  ?   Left Ear: External ear normal.  ?   Nose: Nose normal.  ?   Mouth/Throat:  ?   Mouth: Mucous membranes are moist.  ?   Pharynx: Oropharynx is clear.  ?Eyes:  ?   Extraocular Movements: Extraocular movements intact.  ?   Conjunctiva/sclera: Conjunctivae normal.  ?   Pupils: Pupils are equal, round, and reactive to light.  ?Cardiovascular:  ?   Rate and Rhythm: Normal rate and regular rhythm.  ?   Pulses: Normal pulses.  ?   Heart sounds: Normal heart sounds.  ?Pulmonary:  ?   Effort: Pulmonary effort is normal.  ?   Breath sounds: Examination of the left-lower field reveals decreased breath sounds. Decreased breath sounds present. No wheezing, rhonchi or rales.  ?Musculoskeletal:     ?   General: Normal range of motion.  ?   Cervical back: Normal range of motion and neck supple.  ?Skin: ?   General: Skin is warm and dry.  ?Neurological:  ?   General: No focal deficit present.  ?   Mental Status: He is alert and oriented to person, place, and time.  ?Psychiatric:     ?   Mood and Affect: Mood normal.     ?   Behavior: Behavior normal.     ?   Thought Content: Thought content normal.     ?   Judgment: Judgment normal.  ? ? ?Assessment & Plan:  ? ?Problem List Items Addressed This Visit   ? ?  ? Digestive  ? Gastroesophageal reflux disease without esophagitis  ? Relevant Medications  ? pantoprazole (PROTONIX) 40 MG tablet  ?  ? Other  ? Class 1 obesity due to excess calories with body mass index (BMI) of 32.0 to 32.9 in adult  ? ?Other Visit Diagnoses   ? ? Pleural effusion, left    -  Primary  ? Relevant Orders  ? DG Chest 2 View (Completed)  ? CBC with Differential/Platelet  ? Comp. Metabolic Panel (12)  ? Brain natriuretic peptide  ? Screening PSA (prostate specific antigen)      ? Relevant Orders  ? PSA  ? Screening for HIV (human immunodeficiency virus)      ? Relevant Orders  ? HIV antibody (with reflex)  ? Cold sore      ?  Encounter for HCV screening test for low risk patient      ? Relevant Orders  ? HCV Ab w Reflex to Quant PCR  ? ?  ? ?1. Pleural effusion, left ? ?Impression: ?1.  Tiny nonobstructing lower  left renal stone.  No hydronephrosis.  No ureteral or bladder stones. ?2.  Small dependent left pleural effusion with mild to moderate compressive atelectasis in the dependent left lower lobe.  From 10/27/2021 ? ?Will obtain chest x-ray to see if any improvement since 10/27/2021.  Patient still does have some discomfort, has been 12 days since his CT, patient has vacationed at the beach since then.  ? ?Patient given appointment to establish care Primary Care at [facility] ?- DG Chest 2 View ?- CBC with Differential/Platelet ?- Comp. Metabolic Panel (12) ?- Brain natriuretic peptide ? ?2. Gastroesophageal reflux disease without esophagitis ?Trial Protonix ?- pantoprazole (PROTONIX) 40 MG tablet; Take 1 tablet (40 mg total) by mouth daily.  Dispense: 30 tablet; Refill: 2 ? ?3. Screening PSA (prostate specific antigen) ? ?- PSA ? ?4. Screening for HIV (human immunodeficiency virus) ? ?- HIV antibody (with reflex) ? ?5. Cold sore ?Continue current regimen ? ?6. Encounter for HCV screening test for low risk patient ? ?- HCV Ab w Reflex to Quant PCR ? ?7. Class 1 obesity due to excess calories with body mass index (BMI) of 32.0 to 32.9 in adult, unspecified whether serious comorbidity present ? ? ? ?I have reviewed the patient's medical history (PMH, PSH, Social History, Family History, Medications, and allergies) , and have been updated if relevant. I spent 30 minutes reviewing chart and  face to face time with patient. ? ? ? ?Return for needs PCP here .  ? ?Lamari Youngers S Mayers, PA-C ? ? ?

## 2021-11-08 NOTE — Patient Instructions (Signed)
We will call you with today's results as soon as they are available. ? ?To help with your GERD, I encourage you to do a trial of Protonix once daily. ? ?Kennieth Rad, PA-C ?Physician Assistant ?[place] ?[email or web address] ? ? ?Pleural Effusion ?Pleural effusion is an abnormal buildup of fluid in the layers of tissue between the lungs and the inside of the chest. This space is called the pleural space. The two layers of tissue that line the lungs and the inside of the chest are called pleura. Usually, there is no air in the space between the pleura, only a thin layer of fluid. Some conditions can cause a large amount of fluid to build up, which can cause the lung to collapse if untreated. A pleural effusion is usually caused by another disease that requires treatment. ?What are the causes? ?Pleural effusion can be caused by: ?Conditions that change the pressure in blood vessels, which causes blood and fluid to flow outside their normal areas. These conditions include: ?Partially collapsed lung tissue (atelectasis). ?Heart failure. ?Nephrotic syndrome. This is a kidney condition that causes the body to pass too much protein in urine. ?Peritoneal dialysis. This is a procedure in which the lining of the stomach is used to filter blood as a treatment for kidney failure. ?Liver disease (cirrhosis). ?Fluid buildup caused by damaged tissue or cells in the body. This fluid buildup can be caused by: ?Fluid in the abdomen (ascites). ?Connective tissue diseases, such as lupus or rheumatoid arthritis. ?Certain infections, such as pneumonia or tuberculosis. ?Cancer. ?What are the signs or symptoms? ?In some cases, pleural effusion may cause no symptoms. If symptoms are present, they may include: ?Shortness of breath, especially when lying down. ?Chest pain. This may get worse when taking a deep breath. ?Fever. ?Dry, long-lasting (chronic) cough. ?Fatigue. ?Rapid  breathing. ?Night sweats. ?An underlying condition that is causing the pleural effusion may also cause other symptoms. These conditions include heart failure, pneumonia, blood clots, tuberculosis, or cancer. ?How is this diagnosed? ?This condition may be diagnosed based on: ?Your symptoms and medical history. ?A physical exam. ?A chest X-ray. ?A procedure to use a needle to remove fluid from the pleural space (thoracentesis). This fluid is tested. ?Other imaging studies of the chest, such as an ultrasound or a CT scan. ?How is this treated? ?Depending on the cause of your condition, treatment may include: ?Treating the underlying condition that is causing the pleural effusion. When that condition improves, the effusion will also improve. Examples of treatment for underlying conditions include: ?Antibiotic medicines to treat an infection. ?Diuretics or other heart medicines to treat heart failure. ?Thoracentesis. ?Placing a thin, flexible tube under your skin and into your chest to continuously drain the effusion (indwelling pleural catheter). ?Surgery to remove the outer layer of tissue from the pleural space (decortication). ?A procedure to put medicine into the chest cavity to seal the pleural space and prevent fluid buildup (pleurodesis). ?Chemotherapy and radiation therapy, if your pleural effusion is caused by cancer. These treatments are typically used to kill certain cells in the body. ?Follow these instructions at home: ?Take over-the-counter and prescription medicines only as told by your health care provider. ?Keep track of how long you are able to do mild exercise, such as walking, before you get short of breath. Write down this information to share with your health care provider. Your ability to exercise should improve over time. ?Do not use any products that contain nicotine or tobacco. These products include  cigarettes, chewing tobacco, and vaping devices, such as e-cigarettes. If you need help  quitting, ask your health care provider. ?Return to your normal activities as told by your health care provider. Ask your health care provider what activities are safe for you. ?Keep all follow-up visits. This is important. ?Contact a health care provider if: ?The amount of time that you are able to do mild exercise decreases or does not improve with time. ?You have a fever. ?Get help right away if: ?You are short of breath. ?You develop chest pain. ?You develop a new cough. ?These symptoms may be an emergency. Get help right away. Call 911. ?Do not wait to see if the symptoms will go away. ?Do not drive yourself to the hospital. ?Summary ?Pleural effusion is an abnormal buildup of fluid in the layers of tissue between the lungs and the inside of the chest. ?Pleural effusion can have many causes, including heart failure, pulmonary embolism, infections, or cancer. ?Symptoms of pleural effusion can include shortness of breath, chest pain, fever, long-lasting (chronic) cough, hiccups, or rapid breathing. ?Diagnosis often involves making images of the chest (such as with ultrasound or X-ray) and removing fluid (thoracentesis) to send for testing. ?Treatment for pleural effusion depends on the underlying condition that is causing it. ?This information is not intended to replace advice given to you by your health care provider. Make sure you discuss any questions you have with your health care provider. ?Document Revised: 01/25/2021 Document Reviewed: 01/25/2021 ?Elsevier Patient Education ? [place]. ? ?

## 2021-11-10 ENCOUNTER — Telehealth: Payer: Self-pay | Admitting: Physician Assistant

## 2021-11-10 DIAGNOSIS — J90 Pleural effusion, not elsewhere classified: Secondary | ICD-10-CM

## 2021-11-10 NOTE — Telephone Encounter (Signed)
Copied from [place] [phone]. Topic: General - Other ?>> Nov 10, 2021  9:24 AM Yvette Rack wrote: ?Reason for CRM: Pt called for x-ray results. Pt requests call back at  [phone] ?

## 2021-11-11 ENCOUNTER — Telehealth: Payer: Self-pay | Admitting: Physician Assistant

## 2021-11-11 DIAGNOSIS — J90 Pleural effusion, not elsewhere classified: Secondary | ICD-10-CM

## 2021-11-11 DIAGNOSIS — J189 Pneumonia, unspecified organism: Secondary | ICD-10-CM

## 2021-11-11 LAB — COMP. METABOLIC PANEL (12)
AST: 20 IU/L (ref 0–40)
Albumin/Globulin Ratio: 1.9 (ref 1.2–2.2)
Albumin: 4.4 g/dL (ref 3.8–4.9)
Alkaline Phosphatase: 76 IU/L (ref 44–121)
BUN/Creatinine Ratio: 14 (ref 9–20)
BUN: 18 mg/dL (ref 6–24)
Bilirubin Total: 0.2 mg/dL (ref 0.0–1.2)
Calcium: 9.1 mg/dL (ref 8.7–10.2)
Chloride: 105 mmol/L (ref 96–106)
Creatinine, Ser: 1.28 mg/dL — ABNORMAL HIGH (ref 0.76–1.27)
Globulin, Total: 2.3 g/dL (ref 1.5–4.5)
Glucose: 97 mg/dL (ref 70–99)
Potassium: 4.6 mmol/L (ref 3.5–5.2)
Sodium: 143 mmol/L (ref 134–144)
Total Protein: 6.7 g/dL (ref 6.0–8.5)
eGFR: 65 mL/min/{1.73_m2} (ref >59)

## 2021-11-11 LAB — CBC WITH DIFFERENTIAL/PLATELET

## 2021-11-11 LAB — HCV INTERPRETATION

## 2021-11-11 LAB — HIV ANTIBODY (ROUTINE TESTING W REFLEX): HIV Screen 4th Generation wRfx: NONREACTIVE

## 2021-11-11 LAB — HCV AB W REFLEX TO QUANT PCR: HCV Ab: NONREACTIVE

## 2021-11-11 LAB — PSA: Prostate Specific Ag, Serum: 1 ng/mL (ref 0.0–4.0)

## 2021-11-11 LAB — BRAIN NATRIURETIC PEPTIDE

## 2021-11-11 NOTE — Telephone Encounter (Signed)
-----   Message from Kennieth Rad, [place] sent at 11/10/2021  4:28 PM EDT ----- ?Please call patient and let him know that his chest x-ray did continue to show a small left effusion.  Unfortunately his lab results to rule out possible heart failure or infection were not resulted due to lab error.  Please encourage patient to return to clinic in the morning for repeat testing.  Please inquire of his current symptoms progress. ?

## 2021-11-11 NOTE — Telephone Encounter (Signed)
MA has released future lab in order for [facility] to result patients levels. ?

## 2021-11-11 NOTE — Telephone Encounter (Signed)
Patient verified DOB ?Patient is aware of small effusion still being noted and needing to report to [place] for a repeat BNP. Patient will report today for testing, ?

## 2021-11-11 NOTE — Telephone Encounter (Signed)
Copied from [place] [id number]. Topic: General - Other ?>> Nov 11, 2021 12:43 PM Tessa Lerner A wrote: ?Reason for CRM: The patient has called to request that their lab orders be released or re-submitted to the following facility so that their labs may be completed  ? ?[facility] ?[address]  ?[place], [place] [address].  ?Phone: [phone].  ?Fax: [phone] ? ?Please contact further if needed ?

## 2021-11-12 ENCOUNTER — Other Ambulatory Visit: Payer: Self-pay | Admitting: Physician Assistant

## 2021-11-12 DIAGNOSIS — J90 Pleural effusion, not elsewhere classified: Secondary | ICD-10-CM

## 2021-11-12 LAB — BRAIN NATRIURETIC PEPTIDE: BNP: 11.9 pg/mL (ref 0.0–100.0)

## 2021-11-12 MED ORDER — AZITHROMYCIN 250 MG PO TABS: ORAL_TABLET | ORAL | 0 refills | Status: DC

## 2021-11-12 NOTE — Addendum Note (Signed)
Addended by: Kennieth Rad on: 11/12/2021 03:34 PM ? ? Modules accepted: Orders ? ?

## 2021-11-12 NOTE — Progress Notes (Signed)
error 

## 2021-11-14 ENCOUNTER — Telehealth: Payer: Self-pay | Admitting: *Deleted

## 2021-11-14 NOTE — Telephone Encounter (Signed)
Patient verified DOB ?Patient is aware of results and needing to be treated for the effusion. Patient will pick up medication today and FU with [facility] on 11/21/21 at 3pm. ?

## 2021-11-14 NOTE — Telephone Encounter (Signed)
-----   Message from Kennieth Rad, [place] sent at 11/12/2021  3:34 PM EDT ----- ?Please call patient and let him know that his labs did not show any signs of heart failure.  In order to help resolve the effusion, we will treat for pneumonia, he will take azithromycin.  I do encourage him to schedule a follow-up chest x-ray to ensure resolution.  He does have a follow-up appointment with Gypsy Balsam on Nov 29, 2021.  However please have him follow-up with the mobile team May 8 through May 9 for follow-up.  Prescription sent to pharmacy. ?

## 2021-11-21 ENCOUNTER — Encounter: Payer: Self-pay | Admitting: Physician Assistant

## 2021-11-21 ENCOUNTER — Ambulatory Visit: Payer: 59 | Admitting: Physician Assistant

## 2021-11-21 ENCOUNTER — Ambulatory Visit (INDEPENDENT_AMBULATORY_CARE_PROVIDER_SITE_OTHER): Payer: 59

## 2021-11-21 VITALS — BP 133/79 | HR 67 | Temp 98.7°F | Resp 18 | Ht 70.5 in | Wt 220.0 lb

## 2021-11-21 DIAGNOSIS — J90 Pleural effusion, not elsewhere classified: Secondary | ICD-10-CM

## 2021-11-21 DIAGNOSIS — J189 Pneumonia, unspecified organism: Secondary | ICD-10-CM

## 2021-11-21 NOTE — Progress Notes (Signed)
Patient has eaten today. ?Patient has not taken medication today. ?Patient denies pain at this time. ?

## 2021-11-21 NOTE — Patient Instructions (Signed)
We will call you with today's x-ray result. ? ?Please let us know if there is anything else we can do for you. ? ?Kennieth Rad, PA-C ?Physician Assistant ?[place] ?[email or web address] ? ? ?Health Maintenance, Male ?Adopting a healthy lifestyle and getting preventive care are important in promoting health and wellness. Ask your health care provider about: ?The right schedule for you to have regular tests and exams. ?Things you can do on your own to prevent diseases and keep yourself healthy. ?What should I know about diet, weight, and exercise? ?Eat a healthy diet ? ?Eat a diet that includes plenty of vegetables, fruits, low-fat dairy products, and lean protein. ?Do not eat a lot of foods that are high in solid fats, added sugars, or sodium. ?Maintain a healthy weight ?Body mass index (BMI) is a measurement that can be used to identify possible weight problems. It estimates body fat based on height and weight. Your health care provider can help determine your BMI and help you achieve or maintain a healthy weight. ?Get regular exercise ?Get regular exercise. This is one of the most important things you can do for your health. Most adults should: ?Exercise for at least 150 minutes each week. The exercise should increase your heart rate and make you sweat (moderate-intensity exercise). ?Do strengthening exercises at least twice a week. This is in addition to the moderate-intensity exercise. ?Spend less time sitting. Even light physical activity can be beneficial. ?Watch cholesterol and blood lipids ?Have your blood tested for lipids and cholesterol at 57 years of age, then have this test every 5 years. ?You may need to have your cholesterol levels checked more often if: ?Your lipid or cholesterol levels are high. ?You are older than 57 years of age. ?You are at high risk for heart disease. ?What should I know about cancer screening? ?Many types of cancers can  be detected early and may often be prevented. Depending on your health history and family history, you may need to have cancer screening at various ages. This may include screening for: ?Colorectal cancer. ?Prostate cancer. ?Skin cancer. ?Lung cancer. ?What should I know about heart disease, diabetes, and high blood pressure? ?Blood pressure and heart disease ?High blood pressure causes heart disease and increases the risk of stroke. This is more likely to develop in people who have high blood pressure readings or are overweight. ?Talk with your health care provider about your target blood pressure readings. ?Have your blood pressure checked: ?Every 3-5 years if you are 59-49 years of age. ?Every year if you are 100 years old or older. ?If you are between the ages of 68 and 26 and are a current or former smoker, ask your health care provider if you should have a one-time screening for abdominal aortic aneurysm (AAA). ?Diabetes ?Have regular diabetes screenings. This checks your fasting blood sugar level. Have the screening done: ?Once every three years after age 23 if you are at a normal weight and have a low risk for diabetes. ?More often and at a younger age if you are overweight or have a high risk for diabetes. ?What should I know about preventing infection? ?Hepatitis B ?If you have a higher risk for hepatitis B, you should be screened for this virus. Talk with your health care provider to find out if you are at risk for hepatitis B infection. ?Hepatitis C ?Blood testing is recommended for: ?Everyone born from [birth year] through 1966. ?Anyone with known risk factors for hepatitis C. ?Sexually transmitted  infections (STIs) ?You should be screened each year for STIs, including gonorrhea and chlamydia, if: ?You are sexually active and are younger than 58 years of age. ?You are older than 57 years of age and your health care provider tells you that you are at risk for this type of infection. ?Your sexual activity has  changed since you were last screened, and you are at increased risk for chlamydia or gonorrhea. Ask your health care provider if you are at risk. ?Ask your health care provider about whether you are at high risk for HIV. Your health care provider may recommend a prescription medicine to help prevent HIV infection. If you choose to take medicine to prevent HIV, you should first get tested for HIV. You should then be tested every 3 months for as long as you are taking the medicine. ?Follow these instructions at home: ?Alcohol use ?Do not drink alcohol if your health care provider tells you not to drink. ?If you drink alcohol: ?Limit how much you have to 0-2 drinks a day. ?Know how much alcohol is in your drink. In the [place] one drink equals one 12 oz bottle of beer (355 mL), one 5 oz glass of wine (148 mL), or one 1? oz glass of hard liquor (44 mL). ?Lifestyle ?Do not use any products that contain nicotine or tobacco. These products include cigarettes, chewing tobacco, and vaping devices, such as e-cigarettes. If you need help quitting, ask your health care provider. ?Do not use street drugs. ?Do not share needles. ?Ask your health care provider for help if you need support or information about quitting drugs. ?General instructions ?Schedule regular health, dental, and eye exams. ?Stay current with your vaccines. ?Tell your health care provider if: ?You often feel depressed. ?You have ever been abused or do not feel safe at home. ?Summary ?Adopting a healthy lifestyle and getting preventive care are important in promoting health and wellness. ?Follow your health care provider's instructions about healthy diet, exercising, and getting tested or screened for diseases. ?Follow your health care provider's instructions on monitoring your cholesterol and blood pressure. ?This information is not intended to replace advice given to you by your health care provider. Make sure you discuss any questions you have with your health  care provider. ?Document Revised: 11/22/2020 Document Reviewed: 11/22/2020 ?Elsevier Patient Education ? [employer]. ? ?

## 2021-11-21 NOTE — Progress Notes (Signed)
? ?Established Patient Office Visit ? ?Subjective   ?Patient ID: JOSUE NIGG, male    DOB: Dec 18, 1965  Age: 56 y.o. MRN: [id number] ? ?Chief Complaint  ?Patient presents with  ? Follow-up  ? ? ?States that he feels all of the discomfort that he had in his chest has resolved.  States that he did take the azithromycin as directed, and was working on deep breathing exercises. ? ?No new concerns at this time ? ? ?History reviewed. No pertinent past medical history. ?Social History  ? ?Socioeconomic History  ? Marital status: Married  ?  Spouse name: Not on file  ? Number of children: Not on file  ? Years of education: Not on file  ? Highest education level: Not on file  ?Occupational History  ? Not on file  ?Tobacco Use  ? Smoking status: Never  ? Smokeless tobacco: Never  ?Substance and Sexual Activity  ? Alcohol use: No  ? Drug use: No  ? Sexual activity: Yes  ?Other Topics Concern  ? Not on file  ?Social History Narrative  ? Not on file  ? ?Social Determinants of Health  ? ?Financial Resource Strain: Not on file  ?Food Insecurity: Not on file  ?Transportation Needs: Not on file  ?Physical Activity: Not on file  ?Stress: Not on file  ?Social Connections: Not on file  ?Intimate Partner Violence: Not on file  ? ?History reviewed. No pertinent family history. ?No Known Allergies ?  ? ?Review of Systems  ?Constitutional:  Negative for chills and fever.  ?HENT:  Negative for congestion.   ?Eyes: Negative.   ?Respiratory:  Negative for cough and shortness of breath.   ?Cardiovascular:  Negative for chest pain.  ?Gastrointestinal: Negative.   ?Genitourinary: Negative.   ?Musculoskeletal: Negative.   ?Neurological: Negative.   ?Endo/Heme/Allergies: Negative.   ?Psychiatric/Behavioral: Negative.    ? ?  ?Objective:  ?  ? ?BP 133/79 (BP Location: Left Arm, Patient Position: Sitting, Cuff Size: Normal)   Pulse 67   Temp 98.7 ?F (37.1 ?C) (Oral)   Resp 18   Ht 5' 10.5" (1.791 m)   Wt 220 lb (99.8 kg)   SpO2 98%   BMI  31.12 kg/m?  ? ? ?Physical Exam ?Vitals and nursing note reviewed.  ?Constitutional:   ?   Appearance: Normal appearance.  ?HENT:  ?   Head: Normocephalic and atraumatic.  ?   Right Ear: External ear normal.  ?   Left Ear: External ear normal.  ?   Nose: Nose normal.  ?   Mouth/Throat:  ?   Mouth: Mucous membranes are moist.  ?   Pharynx: Oropharynx is clear.  ?Eyes:  ?   Extraocular Movements: Extraocular movements intact.  ?   Conjunctiva/sclera: Conjunctivae normal.  ?   Pupils: Pupils are equal, round, and reactive to light.  ?Cardiovascular:  ?   Rate and Rhythm: Normal rate and regular rhythm.  ?   Pulses: Normal pulses.  ?   Heart sounds: Normal heart sounds.  ?Pulmonary:  ?   Effort: Pulmonary effort is normal.  ?   Breath sounds: Normal breath sounds. No wheezing.  ?Musculoskeletal:     ?   General: Normal range of motion.  ?   Cervical back: Normal range of motion and neck supple.  ?Skin: ?   General: Skin is warm and dry.  ?Neurological:  ?   General: No focal deficit present.  ?   Mental Status: He is alert and oriented  to person, place, and time.  ?Psychiatric:     ?   Mood and Affect: Mood normal.     ?   Behavior: Behavior normal.     ?   Thought Content: Thought content normal.     ?   Judgment: Judgment normal.  ? ? ? ?  ?Assessment & Plan:  ? ?Problem List Items Addressed This Visit   ?None ? ?1. Pleural effusion, left ?Repeat chest x-ray today.  Patient education given on supportive care. ? ?Patient has appointment to establish care at [facility] at [facility]. ? ?Red flags given for prompt reevaluation ?- DG Chest 2 View ? ? ?I have reviewed the patient's medical history (PMH, PSH, Social History, Family History, Medications, and allergies) , and have been updated if relevant. I spent 20 minutes reviewing chart and  face to face time with patient. ? ? ? ?No follow-ups on file.  ? ? ?Clinton Dragone S Mayers, PA-C ? ?

## 2021-11-23 NOTE — Progress Notes (Deleted)
Patient ID: Dakota [name], male    DOB: Aug 11, 1965  MRN: [id number]  CC: No chief complaint on file.   Subjective: Dakota [name] is a 56 y.o. male who presents for  His concerns today include:  Recent appointment 11/21/2021 with Carrolyn Meiers, PA for pleural effusion left. Repeat chest x-ray ordered. Also, patient education given on supportive care.  Unchanged small left pleural effusion and/or pleural thickening with associated scarring or atelectasis. No new airspace opacity.  Patient Active Problem List   Diagnosis Date Noted   Gastroesophageal reflux disease without esophagitis 11/08/2021   Class 1 obesity due to excess calories with body mass index (BMI) of 32.0 to 32.9 in adult 11/08/2021     Current Outpatient Medications on File Prior to Visit  Medication Sig Dispense Refill   pantoprazole (PROTONIX) 40 MG tablet Take 1 tablet (40 mg total) by mouth daily. 30 tablet 2   No current facility-administered medications on file prior to visit.    No Known Allergies  Social History   Socioeconomic History   Marital status: Married    Spouse name: Not on file   Number of children: Not on file   Years of education: Not on file   Highest education level: Not on file  Occupational History   Not on file  Tobacco Use   Smoking status: Never   Smokeless tobacco: Never  Substance and Sexual Activity   Alcohol use: No   Drug use: No   Sexual activity: Yes  Other Topics Concern   Not on file  Social History Narrative   Not on file   Social Determinants of Health   Financial Resource Strain: Not on file  Food Insecurity: Not on file  Transportation Needs: Not on file  Physical Activity: Not on file  Stress: Not on file  Social Connections: Not on file  Intimate Partner Violence: Not on file    No family history on file.  Past Surgical History:  Procedure Laterality Date   finger reattachment Left    SHOULDER ARTHROSCOPY Left     ROS: Review of  Systems Negative except as stated above  PHYSICAL EXAM: There were no vitals taken for this visit.  Physical Exam  {male adult master:310786} {male adult master:310785}     Latest Ref Rng & Units 11/08/2021    2:12 PM 02/11/2010    7:50 PM 09/10/2007   12:31 PM  CMP  Glucose 70 - 99 mg/dL 97   107   96    BUN 6 - 24 mg/dL '18   11   14    '$ Creatinine 0.76 - 1.27 mg/dL 1.28   1.05   0.95    Sodium 134 - 144 mmol/L 143   139   139    Potassium 3.5 - 5.2 mmol/L 4.6   3.7   3.9    Chloride 96 - 106 mmol/L 105   105   106    CO2 19 - 32 mEq/L  25   28    Calcium 8.7 - 10.2 mg/dL 9.1   9.4   9.1    Total Protein 6.0 - 8.5 g/dL 6.7   7.1     Total Bilirubin 0.0 - 1.2 mg/dL 0.2   0.5     Alkaline Phos 44 - 121 IU/L 76   60     AST 0 - 40 IU/L 20   21     ALT 0 - 53 U/L  15  Lipid Panel  No results found for: CHOL, TRIG, HDL, CHOLHDL, VLDL, LDLCALC, LDLDIRECT  CBC    Component Value Date/Time   WBC CANCELED 11/08/2021 1412   WBC 8.1 02/11/2010 1950   RBC CANCELED 11/08/2021 1412   RBC 4.84 02/11/2010 1950   HGB CANCELED 11/08/2021 1412   HCT CANCELED 11/08/2021 1412   PLT CANCELED 11/08/2021 1412   MCV 90.0 02/11/2010 1950   MCH 31.4 02/11/2010 1950   MCHC 34.9 02/11/2010 1950   RDW 12.3 02/11/2010 1950   LYMPHSABS CANCELED 11/08/2021 1412   MONOABS 1.0 02/11/2010 1950   EOSABS CANCELED 11/08/2021 1412   BASOSABS CANCELED 11/08/2021 1412    ASSESSMENT AND PLAN:  There are no diagnoses linked to this encounter.   Patient was given the opportunity to ask questions.  Patient verbalized understanding of the plan and was able to repeat key elements of the plan. Patient was given clear instructions to go to Emergency Department or return to medical center if symptoms don't improve, worsen, or new problems develop.The patient verbalized understanding.   No orders of the defined types were placed in this encounter.    Requested Prescriptions    No prescriptions  requested or ordered in this encounter    No follow-ups on file.  Camillia Herter, NP

## 2021-11-24 ENCOUNTER — Telehealth: Payer: Self-pay | Admitting: *Deleted

## 2021-11-24 NOTE — Telephone Encounter (Signed)
Medical Assistant left message on patient's home and cell voicemail. Voicemail states to give a call back to [place] with [facility] at [phone] ?Patient is aware of the same effusion being noted on the xray and needing to follow up with pulmonology. ?

## 2021-11-24 NOTE — Addendum Note (Signed)
Addended by: Kennieth Rad on: 11/24/2021 07:39 AM ? ? Modules accepted: Orders ? ?

## 2021-11-24 NOTE — Telephone Encounter (Signed)
-----   Message from Kennieth Rad, PA-C sent at 11/24/2021  7:39 AM EDT ----- ?Although patient did report feeling much improved, chest x-ray still shows unchanged small left pleural effusion and/or pleural thickening.  Referral to pulmonology for further evaluation. ?

## 2021-11-29 ENCOUNTER — Ambulatory Visit: Payer: 59 | Admitting: Family

## 2021-11-29 DIAGNOSIS — J90 Pleural effusion, not elsewhere classified: Secondary | ICD-10-CM

## 2021-12-08 NOTE — Progress Notes (Signed)
Erroneous encounter-disregard

## 2021-12-14 ENCOUNTER — Ambulatory Visit: Payer: 59 | Admitting: Internal Medicine

## 2021-12-14 ENCOUNTER — Encounter: Payer: Self-pay | Admitting: Internal Medicine

## 2021-12-14 VITALS — BP 116/74 | HR 77 | Temp 98.1°F | Ht 70.0 in | Wt 225.0 lb

## 2021-12-14 DIAGNOSIS — J90 Pleural effusion, not elsewhere classified: Secondary | ICD-10-CM

## 2021-12-14 NOTE — Patient Instructions (Addendum)
You can try NSAIDS like aleve or ibuprofen for any residual discomfort you have from the fluid around your lung.   Most likely this was a reactive effusion, likely inflammatory from a previous condition such as kidney stones or trauma.   If your symptoms return, please come back and see me we can try to sample the fluid if there is enough.

## 2021-12-14 NOTE — Progress Notes (Signed)
Dakota [name]    [id number]    1966/01/05  Primary Care Physician:Patient, No Pcp Per (Inactive)  Referring Physician: Kennieth Rad, PA-C [place],  [place] [address] Reason for Consultation: pleural effusion Date of Consultation: 12/14/2021  Chief complaint:   Chief Complaint  Patient presents with   Consult    He had some fluid on his lung. He has some shortness of breath but it has resolved.      HPI: Dakota [name] is a 56 y.o. man who presents for new patient evaluation for pleural effusion.   He had left flank pain for about 2 weeks and thought he had kidney stone in the beginning of May. Urologist evaluation was negative for kidney stones but showed left pleural effusion - urologist sent him to a PCP who recommended 5 days of azithromycin with the PA in primary care. The pain is now resolved, but when tries to take a deep breath in. Never had anything like this before.   Never had pneumonia or  bronchitis before. No fevers, chills, occasional cough.    Social history:  Occupation: works in [employer]: lives at home with wife and three boys, dog and cat.  Smoking history: never smoker, no passive smoke exposure.   Social History   Occupational History   Not on file  Tobacco Use   Smoking status: Never   Smokeless tobacco: Never  Substance and Sexual Activity   Alcohol use: No   Drug use: No   Sexual activity: Yes    Relevant family history:  History reviewed. No pertinent family history.  Past Medical History:  Diagnosis Date   GERD (gastroesophageal reflux disease)     Past Surgical History:  Procedure Laterality Date   finger reattachment Left    SHOULDER ARTHROSCOPY Left      Physical Exam: Blood pressure 116/74, pulse 77, temperature 98.1 F (36.7 C), temperature source Oral, height '5\' 10"'$  (1.778 m), weight 225 lb (102.1 kg), SpO2 96 %. Gen:      No acute distress ENT:  no nasal polyps,  mucus membranes moist Lungs:    No increased respiratory effort, symmetric chest wall excursion, clear to auscultation bilaterally, no wheezes or crackles CV:         Regular rate and rhythm; no murmurs, rubs, or gallops.  No pedal edema Abd:      + bowel sounds; soft, non-tender; no distension MSK: no acute synovitis of DIP or PIP joints, no mechanics hands.  Skin:      Warm and dry; no rashes Neuro: normal speech, no focal facial asymmetry Psych: alert and oriented x3, normal mood and affect   Data Reviewed/Medical Decision Making:  Independent interpretation of tests: Imaging:  Review of patient's chest xrays from April and May 2023 images revealed small pleural effusion in the anterior sulcus of the left hemidiaphragm which seems marginally improved on follow up imaging. The patient's images have been independently reviewed by me.    Labs:  Lab Results  Component Value Date   NA 143 11/08/2021   K 4.6 11/08/2021   CL 105 11/08/2021   CO2 25 02/11/2010   Lab Results  Component Value Date   WBC CANCELED 11/08/2021   HGB CANCELED 11/08/2021   HCT CANCELED 11/08/2021   MCV 90.0 02/11/2010   PLT CANCELED 11/08/2021     Immunization status:  Immunization History  Administered Date(s) Administered   Influenza,inj,Quad PF,6+  Mos 04/11/2019   Influenza-Unspecified 04/16/2021   Tdap 04/14/2013     I reviewed prior external note(s) from primary   I reviewed the result(s) of the labs and imaging as noted above.   I have ordered POC ultrasound   Assessment:  Left pleural effusion, likely reactive  Plan/Recommendations: Most likely this was a reactive pleural effusion, potentially in the context of nephrolithiasis although stone may have passed when he saw urology. Unfortunately our POC ultrasound probe is malfunctioning at the moment but this effusion is so small i'm confident it would be too small for thoracentesis sampling. Recommend expectant management and prn nsaids for  pain control. If symptoms worsen I advised him to return to [place] for repeating imaging.   We discussed disease management and progression at length today.   I spent 40 minutes in the care of this patient today including pre-charting, chart review, review of results, face-to-face care, coordination of care and communication with consultants etc.).    Return to Care: Return if symptoms worsen or fail to improve.  Lenice Llamas, MD Pulmonary and [place]  CC: Mayers, Loraine Grip, [place]

## 2021-12-16 ENCOUNTER — Encounter: Payer: 59 | Admitting: Family

## 2022-02-08 ENCOUNTER — Other Ambulatory Visit: Payer: Self-pay | Admitting: Physician Assistant

## 2022-02-08 DIAGNOSIS — K219 Gastro-esophageal reflux disease without esophagitis: Secondary | ICD-10-CM

## 2022-02-09 NOTE — Telephone Encounter (Signed)
Requested Prescriptions  Pending Prescriptions Disp Refills  . pantoprazole (PROTONIX) 40 MG tablet [Pharmacy Med Name: PANTOPRAZOLE SOD DR 40 MG TAB] 30 tablet 2    Sig: TAKE 1 TABLET BY MOUTH EVERY DAY     Gastroenterology: Proton Pump Inhibitors Passed - 02/08/2022  2:29 AM      Passed - Valid encounter within last 12 months    Recent Outpatient Visits          2 months ago Pleural effusion, left   [facility] [place], PA-C   3 months ago Pleural effusion, left   [facility] at [place], [place]

## 2022-06-07 ENCOUNTER — Ambulatory Visit
Admission: RE | Admit: 2022-06-07 | Discharge: 2022-06-07 | Disposition: A | Discharge status: Home / Self Care | Payer: 59 | Source: Ambulatory Visit | Attending: Internal Medicine | Admitting: Internal Medicine

## 2022-06-07 VITALS — BP 113/62 | HR 70 | Temp 97.8°F | Resp 16

## 2022-06-07 DIAGNOSIS — S60512A Abrasion of left hand, initial encounter: Secondary | ICD-10-CM

## 2022-06-07 MED ORDER — MUPIROCIN 2 % EX OINT: 1.0000 | TOPICAL_OINTMENT | Freq: Two times a day (BID) | CUTANEOUS | 0 refills | Status: DC

## 2022-06-07 NOTE — ED Triage Notes (Signed)
Pt c/o injury to left hand last Friday states he scraped it. Afterwards he "folded the skin back over" and let it heal. Since then the wound is red, swollen, causing joint pain.

## 2022-06-07 NOTE — ED Provider Notes (Signed)
[facility]    CSN: [id number] Arrival date & time: 06/07/22  1350      History   Chief Complaint Chief Complaint  Patient presents with   Hand Problem    Wound on hand that is swelling and red and very painful - Entered by patient    HPI Dakota [name] is a 56 y.o. male.   Patient presents with abrasion to dorsal surface of left hand that occurred about 5 days ago.  Patient states that he was at work and scraped it on a piece of wood.  He states that his skin was pulled back so he placed it back over.  Tetanus vaccine is up-to-date within 5 years per patient.  Patient is concerned given that he is still having some pain in that area.  He denies any purulent drainage.  Denies numbness or tingling.     Past Medical History:  Diagnosis Date   GERD (gastroesophageal reflux disease)     Patient Active Problem List   Diagnosis Date Noted   Gastroesophageal reflux disease without esophagitis 11/08/2021   Class 1 obesity due to excess calories with body mass index (BMI) of 32.0 to 32.9 in adult 11/08/2021    Past Surgical History:  Procedure Laterality Date   finger reattachment Left    SHOULDER ARTHROSCOPY Left        Home Medications    Prior to Admission medications   Medication Sig Start Date End Date Taking? Authorizing Provider  mupirocin ointment (BACTROBAN) 2 % Apply 1 Application topically 2 (two) times daily. 06/07/22  Yes Jake Goodson, Hildred Alamin E, FNP  pantoprazole (PROTONIX) 40 MG tablet TAKE 1 TABLET BY MOUTH EVERY DAY 02/09/22   Mayers, Loraine Grip, PA-C    Family History History reviewed. No pertinent family history.  Social History Social History   Tobacco Use   Smoking status: Never   Smokeless tobacco: Never  Substance Use Topics   Alcohol use: No   Drug use: No     Allergies   Patient has no known allergies.   Review of Systems Review of Systems Per HPI  Physical Exam Triage Vital Signs ED Triage Vitals  Enc Vitals Group      BP 06/07/22 1416 113/62     Pulse Rate 06/07/22 1416 70     Resp 06/07/22 1416 16     Temp 06/07/22 1416 97.8 F (36.6 C)     Temp Source 06/07/22 1416 Oral     SpO2 06/07/22 1416 97 %     Weight --      Height --      Head Circumference --      Peak Flow --      Pain Score 06/07/22 1417 6     Pain Loc --      Pain Edu? --      Excl. in [place]? --    No data found.  Updated Vital Signs BP 113/62 (BP Location: Left Arm)   Pulse 70   Temp 97.8 F (36.6 C) (Oral)   Resp 16   SpO2 97%   Visual Acuity Right Eye Distance:   Left Eye Distance:   Bilateral Distance:    Right Eye Near:   Left Eye Near:    Bilateral Near:     Physical Exam Constitutional:      General: He is not in acute distress.    Appearance: Normal appearance. He is not toxic-appearing or diaphoretic.  HENT:     Head:  Normocephalic and atraumatic.  Eyes:     Extraocular Movements: Extraocular movements intact.     Conjunctiva/sclera: Conjunctivae normal.  Pulmonary:     Effort: Pulmonary effort is normal.  Musculoskeletal:     Comments: Grip strength is 5/5.  Neurovascular intact.  Patient has full range of motion of the hand/fingers.  Skin:    Comments: Patient has a scabbed over abrasion that is approximately 1 cm in diameter present to dorsal surface of left hand between first and second digit.  No bleeding noted.  No purulent drainage.  There is also no swelling.  He does have surrounding erythema with no induration that extends to about 2 cm in diameter including the scabbed over area.  Neurological:     General: No focal deficit present.     Mental Status: He is alert and oriented to person, place, and time. Mental status is at baseline.  Psychiatric:        Mood and Affect: Mood normal.        Behavior: Behavior normal.        Thought Content: Thought content normal.        Judgment: Judgment normal.      UC Treatments / Results  Labs (all labs ordered are listed, but only abnormal results  are displayed) Labs Reviewed - No data to display  EKG   Radiology No results found.  Procedures Procedures (including critical care time)  Medications Ordered in UC Medications - No data to display  Initial Impression / Assessment and Plan / UC Course  I have reviewed the triage vital signs and the nursing notes.  Pertinent labs & imaging results that were available during my care of the patient were reviewed by me and considered in my medical decision making (see chart for details).     Patient has a healing abrasion present to dorsal surface of left hand.  There are no current signs of infection or concern for muscular damage.  Therefore, do not think that imaging is necessary.  Tetanus vaccine is up-to-date per patient.  Will treat with mupirocin topically over the next few days.  Patient was advised to monitor very closely for any signs of infection or worsening pain and to follow-up if these occur.  Patient verbalized understanding and was agreeable with plan. Final Clinical Impressions(s) / UC Diagnoses   Final diagnoses:  Abrasion of left hand, initial encounter     Discharge Instructions      Hand appears to be healing well.  There are no current signs of infection.  I have prescribed prescription antibiotic ointment to apply directly to the area over the next few days.  Please monitor very closely for any increased redness, swelling, pus, increased pain and follow-up with urgent care or primary care if this occurs.    ED Prescriptions     Medication Sig Dispense Auth. Provider   mupirocin ointment (BACTROBAN) 2 % Apply 1 Application topically 2 (two) times daily. 22 g Teodora Medici, [place]      PDMP not reviewed this encounter.   Teodora Medici, [place] 06/07/22 1436

## 2022-06-07 NOTE — Discharge Instructions (Signed)
Hand appears to be healing well.  There are no current signs of infection.  I have prescribed prescription antibiotic ointment to apply directly to the area over the next few days.  Please monitor very closely for any increased redness, swelling, pus, increased pain and follow-up with urgent care or primary care if this occurs.

## 2022-07-16 ENCOUNTER — Emergency Department (HOSPITAL_COMMUNITY): Payer: 59

## 2022-07-16 ENCOUNTER — Encounter (HOSPITAL_COMMUNITY): Admission: EM | Disposition: A | Discharge status: Home / Self Care | Payer: Self-pay | Source: Home / Self Care

## 2022-07-16 ENCOUNTER — Other Ambulatory Visit: Payer: Self-pay

## 2022-07-16 ENCOUNTER — Observation Stay (HOSPITAL_BASED_OUTPATIENT_CLINIC_OR_DEPARTMENT_OTHER): Payer: 59 | Admitting: Anesthesiology

## 2022-07-16 ENCOUNTER — Observation Stay (HOSPITAL_COMMUNITY): Payer: 59 | Admitting: Anesthesiology

## 2022-07-16 ENCOUNTER — Encounter (HOSPITAL_COMMUNITY): Payer: Self-pay | Admitting: Surgery

## 2022-07-16 ENCOUNTER — Inpatient Hospital Stay (HOSPITAL_COMMUNITY)
Admission: EM | Admit: 2022-07-16 | Discharge: 2022-07-19 | DRG: 418 | Disposition: A | Discharge status: Home / Self Care | Payer: 59 | Attending: Surgery | Admitting: Surgery

## 2022-07-16 DIAGNOSIS — K219 Gastro-esophageal reflux disease without esophagitis: Secondary | ICD-10-CM | POA: Diagnosis present

## 2022-07-16 DIAGNOSIS — K828 Other specified diseases of gallbladder: Secondary | ICD-10-CM | POA: Diagnosis present

## 2022-07-16 DIAGNOSIS — Z79899 Other long term (current) drug therapy: Secondary | ICD-10-CM

## 2022-07-16 DIAGNOSIS — K810 Acute cholecystitis: Secondary | ICD-10-CM | POA: Diagnosis not present

## 2022-07-16 DIAGNOSIS — K819 Cholecystitis, unspecified: Secondary | ICD-10-CM | POA: Diagnosis present

## 2022-07-16 DIAGNOSIS — K8000 Calculus of gallbladder with acute cholecystitis without obstruction: Secondary | ICD-10-CM | POA: Diagnosis not present

## 2022-07-16 DIAGNOSIS — K821 Hydrops of gallbladder: Secondary | ICD-10-CM | POA: Diagnosis present

## 2022-07-16 DIAGNOSIS — Z9049 Acquired absence of other specified parts of digestive tract: Secondary | ICD-10-CM

## 2022-07-16 DIAGNOSIS — K838 Other specified diseases of biliary tract: Secondary | ICD-10-CM | POA: Diagnosis present

## 2022-07-16 HISTORY — PX: CHOLECYSTECTOMY: SHX55

## 2022-07-16 LAB — URINALYSIS, ROUTINE W REFLEX MICROSCOPIC
Bilirubin Urine: NEGATIVE
Glucose, UA: NEGATIVE mg/dL
Hgb urine dipstick: NEGATIVE
Ketones, ur: NEGATIVE mg/dL
Leukocytes,Ua: NEGATIVE
Nitrite: NEGATIVE
Protein, ur: NEGATIVE mg/dL
Specific Gravity, Urine: 1.026 (ref 1.005–1.030)
pH: 5 (ref 5.0–8.0)

## 2022-07-16 LAB — COMPREHENSIVE METABOLIC PANEL
ALT: 32 U/L (ref 0–44)
AST: 48 U/L — ABNORMAL HIGH (ref 15–41)
Albumin: 3.3 g/dL — ABNORMAL LOW (ref 3.5–5.0)
Alkaline Phosphatase: 73 U/L (ref 38–126)
Anion gap: 13 (ref 5–15)
BUN: 16 mg/dL (ref 6–20)
CO2: 26 mmol/L (ref 22–32)
Calcium: 9.3 mg/dL (ref 8.9–10.3)
Chloride: 97 mmol/L — ABNORMAL LOW (ref 98–111)
Creatinine, Ser: 1.33 mg/dL — ABNORMAL HIGH (ref 0.61–1.24)
GFR, Estimated: >60 mL/min (ref >60)
Glucose, Bld: 120 mg/dL — ABNORMAL HIGH (ref 70–99)
Potassium: 3.8 mmol/L (ref 3.5–5.1)
Sodium: 136 mmol/L (ref 135–145)
Total Bilirubin: 0.7 mg/dL (ref 0.3–1.2)
Total Protein: 6.9 g/dL (ref 6.5–8.1)

## 2022-07-16 LAB — CBC WITH DIFFERENTIAL/PLATELET
Abs Immature Granulocytes: 0.08 10*3/uL — ABNORMAL HIGH (ref 0.00–0.07)
Basophils Absolute: 0.1 10*3/uL (ref 0.0–0.1)
Basophils Relative: 1 %
Eosinophils Absolute: 0.2 10*3/uL (ref 0.0–0.5)
Eosinophils Relative: 2 %
HCT: 37.8 % — ABNORMAL LOW (ref 39.0–52.0)
Hemoglobin: 12.5 g/dL — ABNORMAL LOW (ref 13.0–17.0)
Immature Granulocytes: 1 %
Lymphocytes Relative: 11 %
Lymphs Abs: 1.4 10*3/uL (ref 0.7–4.0)
MCH: 29.6 pg (ref 26.0–34.0)
MCHC: 33.1 g/dL (ref 30.0–36.0)
MCV: 89.4 fL (ref 80.0–100.0)
Monocytes Absolute: 1.4 10*3/uL — ABNORMAL HIGH (ref 0.1–1.0)
Monocytes Relative: 10 %
Neutro Abs: 10.2 10*3/uL — ABNORMAL HIGH (ref 1.7–7.7)
Neutrophils Relative %: 75 %
Platelets: 508 10*3/uL — ABNORMAL HIGH (ref 150–400)
RBC: 4.23 MIL/uL (ref 4.22–5.81)
RDW: 11.5 % (ref 11.5–15.5)
WBC: 13.3 10*3/uL — ABNORMAL HIGH (ref 4.0–10.5)
nRBC: 0 % (ref 0.0–0.2)

## 2022-07-16 LAB — LIPASE, BLOOD: Lipase: 30 U/L (ref 11–51)

## 2022-07-16 SURGERY — LAPAROSCOPIC CHOLECYSTECTOMY
Anesthesia: General | Site: Abdomen

## 2022-07-16 MED ORDER — ONDANSETRON 4 MG PO TBDP
4.0000 mg | ORAL_TABLET | Freq: Four times a day (QID) | ORAL | Status: DC | PRN
  Filled 2022-07-16: qty 1

## 2022-07-16 MED ORDER — ONDANSETRON HCL 4 MG/2ML IJ SOLN
INTRAMUSCULAR | Status: DC | PRN
  Administered 2022-07-16: 4 mg via INTRAVENOUS

## 2022-07-16 MED ORDER — OXYCODONE HCL 5 MG PO TABS
5.0000 mg | ORAL_TABLET | ORAL | Status: DC | PRN
  Administered 2022-07-16 – 2022-07-18 (×4): 5 mg via ORAL
  Filled 2022-07-16 (×4): qty 1

## 2022-07-16 MED ORDER — AMISULPRIDE (ANTIEMETIC) 5 MG/2ML IV SOLN
INTRAVENOUS | Status: AC
  Filled 2022-07-16: qty 4

## 2022-07-16 MED ORDER — BUPIVACAINE HCL 0.25 % IJ SOLN
INTRAMUSCULAR | Status: DC | PRN
  Administered 2022-07-16: 10 mL

## 2022-07-16 MED ORDER — HYDROMORPHONE HCL 1 MG/ML IJ SOLN
1.0000 mg | Freq: Once | INTRAMUSCULAR | Status: AC
  Administered 2022-07-16: 1 mg via INTRAVENOUS
  Filled 2022-07-16: qty 1

## 2022-07-16 MED ORDER — DIPHENHYDRAMINE HCL 25 MG PO CAPS: 25.0000 mg | ORAL_CAPSULE | Freq: Four times a day (QID) | ORAL | Status: DC | PRN

## 2022-07-16 MED ORDER — CHLORHEXIDINE GLUCONATE 0.12 % MT SOLN: 15.0000 mL | Freq: Once | OROMUCOSAL | Status: AC

## 2022-07-16 MED ORDER — AMISULPRIDE (ANTIEMETIC) 5 MG/2ML IV SOLN
10.0000 mg | Freq: Once | INTRAVENOUS | Status: AC
  Administered 2022-07-16: 10 mg via INTRAVENOUS

## 2022-07-16 MED ORDER — STERILE WATER FOR IRRIGATION IR SOLN
Status: DC | PRN
  Administered 2022-07-16: 1000 mL

## 2022-07-16 MED ORDER — FENTANYL CITRATE (PF) 100 MCG/2ML IJ SOLN
INTRAMUSCULAR | Status: AC
  Filled 2022-07-16: qty 2

## 2022-07-16 MED ORDER — PHENYLEPHRINE 80 MCG/ML (10ML) SYRINGE FOR IV PUSH (FOR BLOOD PRESSURE SUPPORT)
PREFILLED_SYRINGE | INTRAVENOUS | Status: DC | PRN
  Administered 2022-07-16: 160 ug via INTRAVENOUS

## 2022-07-16 MED ORDER — LIDOCAINE 2% (20 MG/ML) 5 ML SYRINGE
INTRAMUSCULAR | Status: AC
  Filled 2022-07-16: qty 5

## 2022-07-16 MED ORDER — LACTATED RINGERS IV SOLN: INTRAVENOUS | Status: DC

## 2022-07-16 MED ORDER — SUGAMMADEX SODIUM 200 MG/2ML IV SOLN
INTRAVENOUS | Status: DC | PRN
  Administered 2022-07-16: 200 mg via INTRAVENOUS

## 2022-07-16 MED ORDER — ORAL CARE MOUTH RINSE: 15.0000 mL | Freq: Once | OROMUCOSAL | Status: AC

## 2022-07-16 MED ORDER — PIPERACILLIN-TAZOBACTAM 3.375 G IVPB
3.3750 g | Freq: Three times a day (TID) | INTRAVENOUS | Status: AC
  Administered 2022-07-16: 3.375 g via INTRAVENOUS
  Filled 2022-07-16: qty 50

## 2022-07-16 MED ORDER — OXYCODONE-ACETAMINOPHEN 5-325 MG PO TABS
1.0000 | ORAL_TABLET | Freq: Once | ORAL | Status: AC
  Administered 2022-07-16: 1 via ORAL
  Filled 2022-07-16: qty 1

## 2022-07-16 MED ORDER — ONDANSETRON 4 MG PO TBDP: 4.0000 mg | ORAL_TABLET | Freq: Once | ORAL | Status: DC

## 2022-07-16 MED ORDER — ROCURONIUM BROMIDE 10 MG/ML (PF) SYRINGE
PREFILLED_SYRINGE | INTRAVENOUS | Status: DC | PRN
  Administered 2022-07-16: 60 mg via INTRAVENOUS
  Administered 2022-07-16: 15 mg via INTRAVENOUS

## 2022-07-16 MED ORDER — HYDROMORPHONE HCL 1 MG/ML IJ SOLN
0.5000 mg | INTRAMUSCULAR | Status: DC | PRN
  Administered 2022-07-16 – 2022-07-18 (×3): 0.5 mg via INTRAVENOUS
  Filled 2022-07-16 (×2): qty 0.5

## 2022-07-16 MED ORDER — ROCURONIUM BROMIDE 10 MG/ML (PF) SYRINGE
PREFILLED_SYRINGE | INTRAVENOUS | Status: AC
  Filled 2022-07-16: qty 10

## 2022-07-16 MED ORDER — PROPOFOL 10 MG/ML IV BOLUS
INTRAVENOUS | Status: AC
  Filled 2022-07-16: qty 20

## 2022-07-16 MED ORDER — SODIUM CHLORIDE 0.9 % IR SOLN
Status: DC | PRN
  Administered 2022-07-16: 1000 mL

## 2022-07-16 MED ORDER — MIDAZOLAM HCL 2 MG/2ML IJ SOLN
INTRAMUSCULAR | Status: AC
  Filled 2022-07-16: qty 2

## 2022-07-16 MED ORDER — FENTANYL CITRATE (PF) 250 MCG/5ML IJ SOLN
INTRAMUSCULAR | Status: DC | PRN
  Administered 2022-07-16: 100 ug via INTRAVENOUS
  Administered 2022-07-16: 50 ug via INTRAVENOUS

## 2022-07-16 MED ORDER — CHLORHEXIDINE GLUCONATE 0.12 % MT SOLN
OROMUCOSAL | Status: AC
  Administered 2022-07-16: 15 mL via OROMUCOSAL
  Filled 2022-07-16: qty 15

## 2022-07-16 MED ORDER — PROPOFOL 10 MG/ML IV BOLUS
INTRAVENOUS | Status: DC | PRN
  Administered 2022-07-16: 170 mg via INTRAVENOUS
  Administered 2022-07-16: 20 mg via INTRAVENOUS

## 2022-07-16 MED ORDER — MIDAZOLAM HCL 2 MG/2ML IJ SOLN
INTRAMUSCULAR | Status: DC | PRN
  Administered 2022-07-16: 2 mg via INTRAVENOUS

## 2022-07-16 MED ORDER — HYDROMORPHONE HCL 1 MG/ML IJ SOLN
INTRAMUSCULAR | Status: AC
  Filled 2022-07-16: qty 1

## 2022-07-16 MED ORDER — HYDROMORPHONE HCL 1 MG/ML IJ SOLN: 0.5000 mg | INTRAMUSCULAR | Status: DC | PRN

## 2022-07-16 MED ORDER — ACETAMINOPHEN 10 MG/ML IV SOLN
INTRAVENOUS | Status: AC
  Filled 2022-07-16: qty 100

## 2022-07-16 MED ORDER — SODIUM CHLORIDE 0.9 % IV SOLN: 2.0000 g | INTRAVENOUS | Status: DC

## 2022-07-16 MED ORDER — DEXAMETHASONE SODIUM PHOSPHATE 10 MG/ML IJ SOLN
INTRAMUSCULAR | Status: DC | PRN
  Administered 2022-07-16: 5 mg via INTRAVENOUS

## 2022-07-16 MED ORDER — ONDANSETRON 4 MG PO TBDP
4.0000 mg | ORAL_TABLET | Freq: Once | ORAL | Status: AC
  Administered 2022-07-16: 4 mg via ORAL
  Filled 2022-07-16: qty 1

## 2022-07-16 MED ORDER — ONDANSETRON HCL 4 MG/2ML IJ SOLN
4.0000 mg | Freq: Once | INTRAMUSCULAR | Status: AC
  Administered 2022-07-16: 4 mg via INTRAVENOUS
  Filled 2022-07-16: qty 2

## 2022-07-16 MED ORDER — METHOCARBAMOL 500 MG PO TABS
500.0000 mg | ORAL_TABLET | Freq: Four times a day (QID) | ORAL | Status: DC
  Administered 2022-07-16 – 2022-07-19 (×9): 500 mg via ORAL
  Filled 2022-07-16 (×9): qty 1

## 2022-07-16 MED ORDER — SODIUM CHLORIDE 0.9 % IV BOLUS
1000.0000 mL | Freq: Once | INTRAVENOUS | Status: AC
  Administered 2022-07-16: 1000 mL via INTRAVENOUS

## 2022-07-16 MED ORDER — ACETAMINOPHEN 10 MG/ML IV SOLN
1000.0000 mg | Freq: Once | INTRAVENOUS | Status: DC | PRN
  Administered 2022-07-16: 1000 mg via INTRAVENOUS

## 2022-07-16 MED ORDER — DIPHENHYDRAMINE HCL 50 MG/ML IJ SOLN: 25.0000 mg | Freq: Four times a day (QID) | INTRAMUSCULAR | Status: DC | PRN

## 2022-07-16 MED ORDER — ACETAMINOPHEN 325 MG PO TABS
650.0000 mg | ORAL_TABLET | Freq: Four times a day (QID) | ORAL | Status: DC
  Administered 2022-07-17 – 2022-07-19 (×8): 650 mg via ORAL
  Filled 2022-07-16 (×9): qty 2

## 2022-07-16 MED ORDER — IOHEXOL 350 MG/ML SOLN
75.0000 mL | Freq: Once | INTRAVENOUS | Status: AC | PRN
  Administered 2022-07-16: 75 mL via INTRAVENOUS

## 2022-07-16 MED ORDER — CHLORHEXIDINE GLUCONATE CLOTH 2 % EX PADS
6.0000 | MEDICATED_PAD | Freq: Once | CUTANEOUS | Status: AC
  Administered 2022-07-16: 6 via TOPICAL

## 2022-07-16 MED ORDER — ENOXAPARIN SODIUM 40 MG/0.4ML IJ SOSY
40.0000 mg | PREFILLED_SYRINGE | Freq: Every day | INTRAMUSCULAR | Status: DC
  Administered 2022-07-17: 40 mg via SUBCUTANEOUS
  Filled 2022-07-16 (×2): qty 0.4

## 2022-07-16 MED ORDER — ONDANSETRON HCL 4 MG/2ML IJ SOLN: 4.0000 mg | Freq: Four times a day (QID) | INTRAMUSCULAR | Status: DC | PRN

## 2022-07-16 MED ORDER — FENTANYL CITRATE (PF) 100 MCG/2ML IJ SOLN
25.0000 ug | INTRAMUSCULAR | Status: DC | PRN
  Administered 2022-07-16 (×3): 50 ug via INTRAVENOUS

## 2022-07-16 MED ORDER — DEXAMETHASONE SODIUM PHOSPHATE 10 MG/ML IJ SOLN
INTRAMUSCULAR | Status: AC
  Filled 2022-07-16: qty 1

## 2022-07-16 MED ORDER — 0.9 % SODIUM CHLORIDE (POUR BTL) OPTIME
TOPICAL | Status: DC | PRN
  Administered 2022-07-16: 1000 mL

## 2022-07-16 MED ORDER — PIPERACILLIN-TAZOBACTAM 3.375 G IVPB
3.3750 g | Freq: Three times a day (TID) | INTRAVENOUS | Status: DC
  Administered 2022-07-16: 3.375 g via INTRAVENOUS
  Filled 2022-07-16: qty 50

## 2022-07-16 MED ORDER — PHENYLEPHRINE HCL-NACL 20-0.9 MG/250ML-% IV SOLN: INTRAVENOUS | Status: DC | PRN

## 2022-07-16 MED ORDER — FENTANYL CITRATE (PF) 250 MCG/5ML IJ SOLN
INTRAMUSCULAR | Status: AC
  Filled 2022-07-16: qty 5

## 2022-07-16 MED ORDER — ONDANSETRON HCL 4 MG/2ML IJ SOLN
INTRAMUSCULAR | Status: AC
  Filled 2022-07-16: qty 2

## 2022-07-16 MED ORDER — LIDOCAINE 2% (20 MG/ML) 5 ML SYRINGE
INTRAMUSCULAR | Status: DC | PRN
  Administered 2022-07-16: 80 mg via INTRAVENOUS

## 2022-07-16 MED ORDER — BUPIVACAINE HCL (PF) 0.25 % IJ SOLN
INTRAMUSCULAR | Status: AC
  Filled 2022-07-16: qty 30

## 2022-07-16 MED ORDER — OXYCODONE HCL 5 MG PO TABS
ORAL_TABLET | ORAL | Status: AC
  Administered 2022-07-16: 5 mg
  Filled 2022-07-16: qty 1

## 2022-07-16 SURGICAL SUPPLY — 46 items
ADH SKN CLS APL DERMABOND .7 (GAUZE/BANDAGES/DRESSINGS) ×1
APL PRP STRL LF DISP 70% ISPRP (MISCELLANEOUS) ×1
APPLIER CLIP 5 13 M/L LIGAMAX5 (MISCELLANEOUS) ×1
APR CLP MED LRG 5 ANG JAW (MISCELLANEOUS) ×1
BAG COUNTER SPONGE SURGICOUNT (BAG) ×1 IMPLANT
BAG SPEC RTRVL 10 TROC 200 (ENDOMECHANICALS) ×1
BAG SPNG CNTER NS LX DISP (BAG) ×1
BIOPATCH RED 1 DISK 7.0 (GAUZE/BANDAGES/DRESSINGS) IMPLANT
BLADE CLIPPER SURG (BLADE) IMPLANT
CANISTER SUCT 3000ML PPV (MISCELLANEOUS) ×1 IMPLANT
CHLORAPREP W/TINT 26 (MISCELLANEOUS) ×1 IMPLANT
CLIP APPLIE 5 13 M/L LIGAMAX5 (MISCELLANEOUS) ×1 IMPLANT
COVER SURGICAL LIGHT HANDLE (MISCELLANEOUS) ×1 IMPLANT
DERMABOND ADVANCED .7 DNX12 (GAUZE/BANDAGES/DRESSINGS) ×1 IMPLANT
DRAIN CHANNEL 19F RND (DRAIN) IMPLANT
DRSG TEGADERM 4X4.75 (GAUZE/BANDAGES/DRESSINGS) IMPLANT
ELECT REM PT RETURN 9FT ADLT (ELECTROSURGICAL) ×1
ELECTRODE REM PT RTRN 9FT ADLT (ELECTROSURGICAL) ×1 IMPLANT
EVACUATOR SILICONE 100CC (DRAIN) IMPLANT
GLOVE BIOGEL PI IND STRL 6 (GLOVE) ×1 IMPLANT
GLOVE BIOGEL PI MICRO STRL 5.5 (GLOVE) ×1 IMPLANT
GOWN STRL REUS W/ TWL LRG LVL3 (GOWN DISPOSABLE) ×3 IMPLANT
GOWN STRL REUS W/TWL LRG LVL3 (GOWN DISPOSABLE) ×3
KIT BASIN OR (CUSTOM PROCEDURE TRAY) ×1 IMPLANT
KIT TURNOVER KIT B (KITS) ×1 IMPLANT
L-HOOK LAP DISP 36CM (ELECTROSURGICAL) ×1
LHOOK LAP DISP 36CM (ELECTROSURGICAL) ×1 IMPLANT
NS IRRIG 1000ML POUR BTL (IV SOLUTION) ×1 IMPLANT
PAD ARMBOARD 7.5X6 YLW CONV (MISCELLANEOUS) ×1 IMPLANT
PENCIL BUTTON HOLSTER BLD 10FT (ELECTRODE) ×1 IMPLANT
POUCH RETRIEVAL ECOSAC 10 (ENDOMECHANICALS) IMPLANT
POUCH RETRIEVAL ECOSAC 10MM (ENDOMECHANICALS) ×1
SCISSORS LAP 5X35 DISP (ENDOMECHANICALS) ×1 IMPLANT
SET IRRIG TUBING LAPAROSCOPIC (IRRIGATION / IRRIGATOR) ×1 IMPLANT
SET TUBE SMOKE EVAC HIGH FLOW (TUBING) ×1 IMPLANT
SLEEVE ENDOPATH XCEL 5M (ENDOMECHANICALS) ×2 IMPLANT
SUT ETHILON 2 0 FS 18 (SUTURE) IMPLANT
SUT MNCRL AB 4-0 PS2 18 (SUTURE) ×1 IMPLANT
SUT VICRYL 0 UR6 27IN ABS (SUTURE) IMPLANT
TOWEL GREEN STERILE (TOWEL DISPOSABLE) ×1 IMPLANT
TOWEL GREEN STERILE FF (TOWEL DISPOSABLE) ×1 IMPLANT
TRAY LAPAROSCOPIC MC (CUSTOM PROCEDURE TRAY) ×1 IMPLANT
TROCAR XCEL BLUNT TIP 100MML (ENDOMECHANICALS) ×1 IMPLANT
TROCAR Z-THREAD OPTICAL 5X100M (TROCAR) ×1 IMPLANT
WARMER LAPAROSCOPE (MISCELLANEOUS) ×1 IMPLANT
WATER STERILE IRR 1000ML POUR (IV SOLUTION) ×1 IMPLANT

## 2022-07-16 NOTE — H&P (Signed)
Dakota [name] 03-Jan-1966  [id number].    Requesting MD: Dakota Bi, PA-C Chief Complaint/Reason for Consult: acute cholecystitis  HPI:  Dakota [name] is a 56 yo male who presented to the ED with acute onset abdominal pain, nausea and vomiting. About a week ago he began having nausea, vomiting and diarrhea that was attributed to food poisoning. This continued for several days and was starting to get better, however yesterday he started having severe epigastric abdominal pain. This worsened and he has had vomiting and diarrhea all night. He also endorses subjective chills. Labs in the ED show a mild leukocytosis. LFTs and lipase are normal. A CT scan showed cholecystitis. A RUQ [place] was then done and also showed cholecystitis with sludge in the gallbladder. General surgery was consulted.  He is overall in good health. His only prior abdominal surgery is a lap appendectomy about 10-15 years ago.  ROS: Review of Systems  Constitutional:  Positive for chills. Negative for fever.  Respiratory:  Negative for shortness of breath.   Gastrointestinal:  Positive for abdominal pain, diarrhea, nausea and vomiting.  Neurological:  Negative for loss of consciousness and weakness.    No family history on file.  Past Medical History:  Diagnosis Date   GERD (gastroesophageal reflux disease)     Past Surgical History:  Procedure Laterality Date   finger reattachment Left    SHOULDER ARTHROSCOPY Left     Social History:  reports that he has never smoked. He has never used smokeless tobacco. He reports that he does not drink alcohol and does not use drugs.  Allergies: No Known Allergies  (Not in a hospital admission)    Physical Exam: Blood pressure 135/83, pulse 92, temperature 98 F (36.7 C), resp. rate 18, height '5\' 10"'$  (1.778 m), weight 88.5 kg, SpO2 99 %. General: resting in bed, appears uncomfortable Neurological: alert and oriented, no focal deficits HEENT: normocephalic,  atraumatic CV: regular rate and rhythm Respiratory: normal work of breathing on room air, symmetric chest wall expansion Abdomen: soft, nondistended, tender to palpation in the epigastric area. Extremities: warm and well-perfused Psychiatric: normal mood and affect Skin: warm and dry, no jaundice, no rashes or lesions   Results for orders placed or performed during the hospital encounter of 07/16/22 (from the past 48 hour(s))  CBC with Differential     Status: Abnormal   Collection Time: 07/16/22  2:10 AM  Result Value Ref Range   WBC 13.3 (H) 4.0 - 10.5 K/uL   RBC 4.23 4.22 - 5.81 MIL/uL   Hemoglobin 12.5 (L) 13.0 - 17.0 g/dL   HCT 37.8 (L) 39.0 - 52.0 %   MCV 89.4 80.0 - 100.0 fL   MCH 29.6 26.0 - 34.0 pg   MCHC 33.1 30.0 - 36.0 g/dL   RDW 11.5 11.5 - 15.5 %   Platelets 508 (H) 150 - 400 K/uL   nRBC 0.0 0.0 - 0.2 %   Neutrophils Relative % 75 %   Neutro Abs 10.2 (H) 1.7 - 7.7 K/uL   Lymphocytes Relative 11 %   Lymphs Abs 1.4 0.7 - 4.0 K/uL   Monocytes Relative 10 %   Monocytes Absolute 1.4 (H) 0.1 - 1.0 K/uL   Eosinophils Relative 2 %   Eosinophils Absolute 0.2 0.0 - 0.5 K/uL   Basophils Relative 1 %   Basophils Absolute 0.1 0.0 - 0.1 K/uL   Immature Granulocytes 1 %   Abs Immature Granulocytes 0.08 (H) 0.00 - 0.07 K/uL  Comment: Performed at [facility], [place] [address] [place], [place] [address]  Comprehensive metabolic panel     Status: Abnormal   Collection Time: 07/16/22  2:10 AM  Result Value Ref Range   Sodium 136 135 - 145 mmol/L   Potassium 3.8 3.5 - 5.1 mmol/L   Chloride 97 (L) 98 - 111 mmol/L   CO2 26 22 - 32 mmol/L   Glucose, Bld 120 (H) 70 - 99 mg/dL    Comment: Glucose reference range applies only to samples taken after fasting for at least 8 hours.   BUN 16 6 - 20 mg/dL   Creatinine, Ser 1.33 (H) 0.61 - 1.24 mg/dL   Calcium 9.3 8.9 - 10.3 mg/dL   Total Protein 6.9 6.5 - 8.1 g/dL   Albumin 3.3 (L) 3.5 - 5.0 g/dL   AST 48 (H) 15 - 41 U/L    ALT 32 0 - 44 U/L   Alkaline Phosphatase 73 38 - 126 U/L   Total Bilirubin 0.7 0.3 - 1.2 mg/dL   GFR, Estimated >60 >60 mL/min    Comment: (NOTE) Calculated using the CKD-EPI Creatinine Equation (2021)    Anion gap 13 5 - 15    Comment: Performed at [facility], [place] [address], [place], [place] [address]  Lipase, blood     Status: None   Collection Time: 07/16/22  2:10 AM  Result Value Ref Range   Lipase 30 11 - 51 U/L    Comment: Performed at [facility] [address], [place], [place] [address]  Urinalysis, Routine w reflex microscopic Urine, Clean Catch     Status: Abnormal   Collection Time: 07/16/22  2:12 AM  Result Value Ref Range   Color, Urine YELLOW YELLOW   APPearance HAZY (A) CLEAR   Specific Gravity, Urine 1.026 1.005 - 1.030   pH 5.0 5.0 - 8.0   Glucose, UA NEGATIVE NEGATIVE mg/dL   Hgb urine dipstick NEGATIVE NEGATIVE   Bilirubin Urine NEGATIVE NEGATIVE   Ketones, ur NEGATIVE NEGATIVE mg/dL   Protein, ur NEGATIVE NEGATIVE mg/dL   Nitrite NEGATIVE NEGATIVE   Leukocytes,Ua NEGATIVE NEGATIVE    Comment: Performed at [facility] [address] [place], [place] [address]   US Abdomen Limited RUQ (LIVER/GB)  Result Date: 07/16/2022 CLINICAL DATA:  56 year old male with abnormal gallbladder on CT Abdomen and Pelvis 0424 hours today. EXAM: ULTRASOUND ABDOMEN LIMITED RIGHT UPPER QUADRANT COMPARISON:  CT Abdomen and Pelvis 0424 hours today. FINDINGS: Gallbladder: Gallbladder sludge and wall thickening ranging from 4 mm to 6 mm. Positive sonographic Murphy sign. No pericholecystic fluid. No obstructing gallstone identified. Common bile duct: Diameter: 4 mm, normal. Liver: No focal lesion identified. Within normal limits in parenchymal echogenicity. Portal vein is patent on color Doppler imaging with normal direction of blood flow towards the liver. Other: Negative visible right kidney.  No free fluid. IMPRESSION: 1. Acute Cholecystitis with sludge.  Consider acalculous cholecystitis or occult obstructing gallstone. 2. No evidence of bile duct obstruction. Electronically Signed   By: Genevie Ann M.D.   On: 07/16/2022 07:39   CT ABDOMEN PELVIS W CONTRAST  Result Date: 07/16/2022 CLINICAL DATA:  Nausea, vomiting and diarrhea. EXAM: CT ABDOMEN AND PELVIS WITH CONTRAST TECHNIQUE: Multidetector CT imaging of the abdomen and pelvis was performed using the standard protocol following bolus administration of intravenous contrast. RADIATION DOSE REDUCTION: This exam was performed according to the departmental dose-optimization program which includes automated exposure control, adjustment  of the mA and/or kV according to patient size and/or use of iterative reconstruction technique. CONTRAST:  64m OMNIPAQUE IOHEXOL 350 MG/ML SOLN COMPARISON:  October 27, 2021 FINDINGS: Lower chest: No acute abnormality. Hepatobiliary: No focal liver abnormality is seen. The gallbladder is moderately distended with mild to moderate severity gallbladder wall thickening and pericholecystic inflammatory fat stranding. No gallstones are clearly identified. There is no evidence of biliary dilatation. Pancreas: Unremarkable. No pancreatic ductal dilatation or surrounding inflammatory changes. Spleen: Normal in size without focal abnormality. Adrenals/Urinary Tract: Adrenal glands are unremarkable. Kidneys are normal in size, without renal calculi or hydronephrosis. Stable bilateral parenchymal and parapelvic renal cysts are seen. Bladder is unremarkable. Stomach/Bowel: There is a small hiatal hernia. The appendix is surgically absent. No evidence of bowel wall thickening, distention, or inflammatory changes. Vascular/Lymphatic: No significant vascular findings are present. No enlarged abdominal or pelvic lymph nodes. Reproductive: Prostate is unremarkable. Other: No abdominal wall hernia or abnormality. No abdominopelvic ascites. Musculoskeletal: Degenerative changes are seen within the lumbar  spine. This is most prominent at the levels of L3-L4 and L5-S1. IMPRESSION: 1. Findings consistent with acute cholecystitis. 2. Stable bilateral parenchymal and parapelvic renal cysts. No follow-up imaging is recommended. This recommendation follows ACR consensus guidelines: Management of the Incidental Renal Mass on CT: A White Paper of the ACR Incidental Findings Committee. J Am Coll Radiol [phone] 3. Small hiatal hernia. Electronically Signed   By: TVirgina NorfolkM.D.   On: 07/16/2022 04:42      Assessment/Plan 56yo male presenting with acute epigastric pain, nausea and vomiting. I personally reviewed his labs, notes and imaging. CT and [place] show pericholecystic fluid, consistent with acute cholecystitis. I recommended laparoscopic cholecystectomy and reviewed the procedure details with the patient, including the risks of bleeding, infection, and <0.5% risk of common bile duct injury. He expressed understanding and agrees to proceed. - NPO, IV fluid hydration - Zosyn given in ED - Pain and nausea control - OR today for laparoscopic cholecystectomy - VTE: lovenox, SCDs - Dispo: admit to observation, med-surg floor   SMichaelle Birks MEast [facility], Hepatobiliary and Pancreatic Surgery 07/16/22 8:21 AM

## 2022-07-16 NOTE — Anesthesia Postprocedure Evaluation (Signed)
Anesthesia Post Note  Patient: Dakota [name]  Procedure(s) Performed: LAPAROSCOPIC CHOLECYSTECTOMY (Abdomen)     Patient location during evaluation: PACU Anesthesia Type: General Level of consciousness: awake and alert Pain management: pain level controlled Vital Signs Assessment: post-procedure vital signs reviewed and stable Respiratory status: spontaneous breathing, nonlabored ventilation, respiratory function stable and patient connected to nasal cannula oxygen Cardiovascular status: blood pressure returned to baseline and stable Postop Assessment: no apparent nausea or vomiting Anesthetic complications: no   No notable events documented.  Last Vitals:  Vitals:   07/16/22 1645 07/16/22 1800  BP: 111/73 114/78  Pulse: 82 78  Resp: 15   Temp: 37 C 36.8 C  SpO2: 97% 98%    Last Pain:  Vitals:   07/16/22 1800  TempSrc:   PainSc: 5                  Paula Busenbark P Minta Fair

## 2022-07-16 NOTE — Transfer of Care (Signed)
Immediate Anesthesia Transfer of Care Note  Patient: Dakota [name]  Procedure(s) Performed: LAPAROSCOPIC CHOLECYSTECTOMY (Abdomen)  Patient Location: PACU  Anesthesia Type:General  Level of Consciousness: awake, drowsy, and patient cooperative  Airway & Oxygen Therapy: Patient Spontanous Breathing  Post-op Assessment: Report given to RN, Post -op Vital signs reviewed and stable, and Patient moving all extremities X 4  Post vital signs: Reviewed and stable  Last Vitals:  Vitals Value Taken Time  BP 113/77 07/16/22 1438  Temp    Pulse 82 07/16/22 1441  Resp 27 07/16/22 1441  SpO2 91 % 07/16/22 1441  Vitals shown include unvalidated device data.  Last Pain:  Vitals:   07/16/22 1224  TempSrc: Oral  PainSc:          Complications: No notable events documented.

## 2022-07-16 NOTE — Anesthesia Procedure Notes (Signed)
Procedure Name: Intubation Date/Time: 07/16/2022 1:14 PM  Performed by: Rande Brunt, CRNAPre-anesthesia Checklist: Patient identified, Emergency Drugs available, Suction available and Patient being monitored Patient Re-evaluated:Patient Re-evaluated prior to induction Oxygen Delivery Method: Circle System Utilized Preoxygenation: Pre-oxygenation with 100% oxygen Induction Type: IV induction Ventilation: Mask ventilation without difficulty Laryngoscope Size: Mac and 4 Grade View: Grade I Tube type: Oral Tube size: 7.5 mm Number of attempts: 1 Airway Equipment and Method: Stylet Placement Confirmation: ETT inserted through vocal cords under direct vision, positive ETCO2 and breath sounds checked- equal and bilateral Secured at: 22 cm Tube secured with: Tape Dental Injury: Teeth and Oropharynx as per pre-operative assessment

## 2022-07-16 NOTE — ED Provider Notes (Signed)
[facility] EMERGENCY DEPARTMENT Provider Note   CSN: [id number] Arrival date & time: 07/16/22  0045     History  Chief Complaint  Patient presents with   Abdominal Pain   Nausea    Dakota [name] is a 56 y.o. male with medical history of GERD.  Patient presents to ED for evaluation of right upper quadrant abdominal pain.  Patient reports that beginning 1 week ago after consuming Brendolyn Patty, he developed bloating, diarrhea and generalized abdominal discomfort.  Patient reports that this waxed and waned throughout the week and resolved at one point however returned last night.  Patient reports that pain last night was worse than it ever been.  The patient states that the pain is now isolated to his right upper quadrant.  Patient complaining of nausea, vomiting and diarrhea.  Patient denies fevers.  Patient denies consuming food or water since midnight.   Abdominal Pain Associated symptoms: diarrhea, nausea and vomiting   Associated symptoms: no fever        Home Medications Prior to Admission medications   Medication Sig Start Date End Date Taking? Authorizing Provider  mupirocin ointment (BACTROBAN) 2 % Apply 1 Application topically 2 (two) times daily. 06/07/22   Teodora Medici, FNP  pantoprazole (PROTONIX) 40 MG tablet TAKE 1 TABLET BY MOUTH EVERY DAY 02/09/22   Mayers, Cari S, PA-C      Allergies    Patient has no known allergies.    Review of Systems   Review of Systems  Constitutional:  Negative for fever.  Gastrointestinal:  Positive for abdominal pain, diarrhea, nausea and vomiting.  All other systems reviewed and are negative.   Physical Exam Updated Vital Signs BP 108/71   Pulse 90   Temp 97.7 F (36.5 C) (Oral)   Resp 18   Ht '5\' 10"'$  (1.778 m)   Wt 88.5 kg   SpO2 93%   BMI 27.98 kg/m  Physical Exam Vitals and nursing note reviewed.  Constitutional:      General: He is not in acute distress.    Appearance: He is ill-appearing. He  is not toxic-appearing or diaphoretic.  HENT:     Head: Normocephalic and atraumatic.     Nose: Nose normal.     Mouth/Throat:     Mouth: Mucous membranes are moist.  Cardiovascular:     Rate and Rhythm: Normal rate and regular rhythm.  Pulmonary:     Effort: Pulmonary effort is normal.     Breath sounds: Normal breath sounds. No wheezing.  Abdominal:     Tenderness: There is abdominal tenderness in the right upper quadrant.  Musculoskeletal:     Cervical back: Normal range of motion and neck supple.  Skin:    General: Skin is warm and dry.     Capillary Refill: Capillary refill takes less than 2 seconds.  Neurological:     Mental Status: He is alert and oriented to person, place, and time.     ED Results / Procedures / Treatments   Labs (all labs ordered are listed, but only abnormal results are displayed) Labs Reviewed  CBC WITH DIFFERENTIAL/PLATELET - Abnormal; Notable for the following components:      Result Value   WBC 13.3 (*)    Hemoglobin 12.5 (*)    HCT 37.8 (*)    Platelets 508 (*)    Neutro Abs 10.2 (*)    Monocytes Absolute 1.4 (*)    Abs Immature Granulocytes 0.08 (*)  All other components within normal limits  COMPREHENSIVE METABOLIC PANEL - Abnormal; Notable for the following components:   Chloride 97 (*)    Glucose, Bld 120 (*)    Creatinine, Ser 1.33 (*)    Albumin 3.3 (*)    AST 48 (*)    All other components within normal limits  URINALYSIS, ROUTINE W REFLEX MICROSCOPIC - Abnormal; Notable for the following components:   APPearance HAZY (*)    All other components within normal limits  LIPASE, BLOOD    EKG None  Radiology US Abdomen Limited RUQ (LIVER/GB)  Result Date: 07/16/2022 CLINICAL DATA:  56 year old male with abnormal gallbladder on CT Abdomen and Pelvis 0424 hours today. EXAM: ULTRASOUND ABDOMEN LIMITED RIGHT UPPER QUADRANT COMPARISON:  CT Abdomen and Pelvis 0424 hours today. FINDINGS: Gallbladder: Gallbladder sludge and wall  thickening ranging from 4 mm to 6 mm. Positive sonographic Murphy sign. No pericholecystic fluid. No obstructing gallstone identified. Common bile duct: Diameter: 4 mm, normal. Liver: No focal lesion identified. Within normal limits in parenchymal echogenicity. Portal vein is patent on color Doppler imaging with normal direction of blood flow towards the liver. Other: Negative visible right kidney.  No free fluid. IMPRESSION: 1. Acute Cholecystitis with sludge. Consider acalculous cholecystitis or occult obstructing gallstone. 2. No evidence of bile duct obstruction. Electronically Signed   By: Genevie Ann M.D.   On: 07/16/2022 07:39   CT ABDOMEN PELVIS W CONTRAST  Result Date: 07/16/2022 CLINICAL DATA:  Nausea, vomiting and diarrhea. EXAM: CT ABDOMEN AND PELVIS WITH CONTRAST TECHNIQUE: Multidetector CT imaging of the abdomen and pelvis was performed using the standard protocol following bolus administration of intravenous contrast. RADIATION DOSE REDUCTION: This exam was performed according to the departmental dose-optimization program which includes automated exposure control, adjustment of the mA and/or kV according to patient size and/or use of iterative reconstruction technique. CONTRAST:  38m OMNIPAQUE IOHEXOL 350 MG/ML SOLN COMPARISON:  October 27, 2021 FINDINGS: Lower chest: No acute abnormality. Hepatobiliary: No focal liver abnormality is seen. The gallbladder is moderately distended with mild to moderate severity gallbladder wall thickening and pericholecystic inflammatory fat stranding. No gallstones are clearly identified. There is no evidence of biliary dilatation. Pancreas: Unremarkable. No pancreatic ductal dilatation or surrounding inflammatory changes. Spleen: Normal in size without focal abnormality. Adrenals/Urinary Tract: Adrenal glands are unremarkable. Kidneys are normal in size, without renal calculi or hydronephrosis. Stable bilateral parenchymal and parapelvic renal cysts are seen. Bladder  is unremarkable. Stomach/Bowel: There is a small hiatal hernia. The appendix is surgically absent. No evidence of bowel wall thickening, distention, or inflammatory changes. Vascular/Lymphatic: No significant vascular findings are present. No enlarged abdominal or pelvic lymph nodes. Reproductive: Prostate is unremarkable. Other: No abdominal wall hernia or abnormality. No abdominopelvic ascites. Musculoskeletal: Degenerative changes are seen within the lumbar spine. This is most prominent at the levels of L3-L4 and L5-S1. IMPRESSION: 1. Findings consistent with acute cholecystitis. 2. Stable bilateral parenchymal and parapelvic renal cysts. No follow-up imaging is recommended. This recommendation follows ACR consensus guidelines: Management of the Incidental Renal Mass on CT: A White Paper of the ACR Incidental Findings Committee. J Am Coll Radiol [id number] 3. Small hiatal hernia. Electronically Signed   By: TVirgina NorfolkM.D.   On: 07/16/2022 04:42    Procedures Procedures   Medications Ordered in ED Medications  Chlorhexidine Gluconate Cloth 2 % PADS 6 each (has no administration in time range)  enoxaparin (LOVENOX) injection 40 mg (has no administration in time range)  lactated ringers infusion (  has no administration in time range)  HYDROmorphone (DILAUDID) injection 0.5 mg (has no administration in time range)  diphenhydrAMINE (BENADRYL) capsule 25 mg (has no administration in time range)    Or  diphenhydrAMINE (BENADRYL) injection 25 mg (has no administration in time range)  ondansetron (ZOFRAN-ODT) disintegrating tablet 4 mg (has no administration in time range)    Or  ondansetron (ZOFRAN) injection 4 mg (has no administration in time range)  sodium chloride 0.9 % bolus 1,000 mL (has no administration in time range)  oxyCODONE-acetaminophen (PERCOCET/ROXICET) 5-325 MG per tablet 1 tablet (1 tablet Oral Given 07/16/22 0221)  ondansetron (ZOFRAN-ODT) disintegrating tablet 4 mg (4  mg Oral Given 07/16/22 0221)  iohexol (OMNIPAQUE) 350 MG/ML injection 75 mL (75 mLs Intravenous Contrast Given 07/16/22 0424)  ondansetron (ZOFRAN) injection 4 mg (4 mg Intravenous Given 07/16/22 0749)  HYDROmorphone (DILAUDID) injection 1 mg (1 mg Intravenous Given 07/16/22 0753)    ED Course/ Medical Decision Making/ A&P Clinical Course as of 07/16/22 2751  [facility] Jul 16, 2022  0444 Radiology called with CT read c/w acute cholecystitis. [KH]    Clinical Course User Index [KH] Antonietta Breach, PA-C                           Medical Decision Making  56 year old male presents to ED for evaluation of abdominal pain.  Please see HPI for further details.  On my examination patient found lying right lateral recumbent.  Patient alert and oriented.  The patient is afebrile and nontachycardic.  The patient lung sounds are clear bilaterally.  The patient abdomen has tenderness in the right upper quadrant.  Patient workup initiated in triage includes CBC, CMP, urinalysis, lipase, CT abdomen pelvis.  CBC with slight leukocytosis to 13.3.  The patient hemoglobin stable.  The patient CMP shows elevated creatinine 1.3 however this is consistent with a baseline creatinine collected a months ago.  The patient urinalysis is unremarkable.  The patient lipase is not elevated.  Patient CT abdomen pelvis shows findings consistent with cholecystitis.  Right upper quadrant ultrasound ordered at this time which shows an acute cholecystitis with sludge.  Patient provided 1 mg Dilaudid for pain control.  Zosyn ordered.  At this time general surgery was consulted.  Dr. Zenia Resides has returned my call.  Dr. Zenia Resides has agreed to admit the patient for further management.  Final Clinical Impression(s) / ED Diagnoses Final diagnoses:  Acute cholecystitis    Rx / DC Orders ED Discharge Orders     None         Azucena Cecil, PA-C 07/16/22 7001    Pattricia Boss, MD 07/16/22 [phone]

## 2022-07-16 NOTE — ED Provider Triage Note (Signed)
Emergency Medicine Provider Triage Evaluation Note  Dakota [name] , a 56 y.o. male  was evaluated in triage.  Pt complains of abdominal pain that has been waxing and waning in severity. Onset around midnight. Describes pain as stabbing and squeezing. Associated with nausea and vomiting. Previously had been experiencing N/V/D x 4 days, but felt like these symptoms were improving until acute worsening this evening. As an aside, reports accidentally ingesting some metal shavings when eating at [employer] on 07/04/22. Hx of appendectomy.  Review of Systems  Positive: As above Negative: As above  Physical Exam  BP 134/81 (BP Location: Right Arm)   Pulse 81   Temp 98.4 F (36.9 C)   Resp 20   Ht '5\' 10"'$  (1.778 m)   Wt 88.5 kg   SpO2 99%   BMI 27.98 kg/m  Gen:   Awake, no distress   Resp:  Normal effort  MSK:   Moves extremities without difficulty  Other:  RUQ and epigastric TTP without peritoneal signs.  Medical Decision Making  Medically screening exam initiated at 2:02 AM.  Appropriate orders placed.  Dakota [name] was informed that the remainder of the evaluation will be completed by another provider, this initial triage assessment does not replace that evaluation, and the importance of remaining in the ED until their evaluation is complete.  Undifferentiated abdominal pain - pending labs, CT scan.   Antonietta Breach, PA-C 07/16/22 0205

## 2022-07-16 NOTE — ED Notes (Signed)
Transfer of care given to [place].

## 2022-07-16 NOTE — ED Notes (Signed)
This RN assumed care of patient. Pt arrived to ED presenting with nausea, vomiting and abdominal pain. Pt reports persistent symptoms. Pt has spontaneous breathing, equal bilaterally, and speaks full sentences without difficulty. Pt skin tone is appropriate for ethnicity, mildly moist and warm. Pt connected to CCM, pulse ox and BP. Support persons at bedside. Pt ambulates with stable gait.

## 2022-07-16 NOTE — ED Triage Notes (Signed)
Pt arrived complaining of nausea and vomiting, with severe abdominal pain that comes and goes.   Pt has been having N/D/V since the Tuesday before Christmas has been feeling better but then started to have pain again tonight around midnight   Pt states that he had ingested some medal at a [employer] when the symptoms has started.

## 2022-07-16 NOTE — Op Note (Addendum)
Date: 07/16/22  Patient: Dakota [name] MRN: [id number]  Preoperative Diagnosis: Acute cholecystitis Postoperative Diagnosis: Same  Procedure: Laparoscopic subtotal fenestrating cholecystectomy with drain placement  Surgeon: Michaelle Birks, MD  EBL: 50 mL  Anesthesia: General endotracheal  Specimens: Gallbladder  Indications: Mr. Hengst is a 56 yo male who presented to the ED with acute epigastric abdominal pain, nausea and vomiting. CT scan and [place] were both consistent with acute cholecystitis. After a discussion of the risks and benefits of surgery, he was brought to the OR for cholecystectomy.  Findings: Acute cholecystitis with adherence of the colon and duodenum to the cystic triangle via thick inflammatory tissue, precluding a critical view. Thus a subtotal fenestrating cholecystectomy was performed. 19-Fr JP drain left in the remnant gallbladder.  Procedure details: Informed consent was obtained in the preoperative area prior to the procedure. The patient was brought to the operating room and placed on the table in the supine position. General anesthesia was induced and appropriate lines and drains were placed for intraoperative monitoring. Perioperative antibiotics were administered per SCIP guidelines. The abdomen was prepped and draped in the usual sterile fashion. A pre-procedure timeout was taken verifying patient identity, surgical site and procedure to be performed.  A small supraumbilical skin incision was made, the subcutaneous tissue was divided with cautery, and the umbilical stalk was grasped and elevated. The fascia was incised and the peritoneal cavity was directly visualized. A 78m Hassan trocar was placed and the abdomen was insufflated. The peritoneal cavity was inspected with no evidence of visceral or vascular injury. Three 575mports were placed in the right subcostal margin, all under direct visualization. The fundus of the gallbladder was grasped and retracted  cephalad. The majority of the gallbladder was covered with adherent omentum from the transverse colon. This was gently separated from the gallbladder starting at the top and working towards the infundibulum, using blunt dissection. Towards the infundibulum of the gallbladder, the adhesions were more dense, and medially the duodenum appeared to be adherent near the cystic triangle. These adhesions were thick and dense, and I did not feel that further attempts to obtain a critical view could be safely done without risk of injury to the duodenum and colon. Thus a subtotal cholecystectomy was performed. The anterior wall of the gallbladder was opened with cautery near the dome, and the wall was then opened down to the infundibulum. There was thick, clear nonbilious fluid within the gallbladder consistent with hydrops. The anterior wall was removed with cautery, placed in an Ecosac. The mucosa of the gallbladder appeared necrotic, and the necrotic mucosa on the posterior wall was sloughed off. The remaining mucosa on the back wall was fulgurated with cautery. Several small stones were extracted. There was no bile noted within the small infundibular remnant. The surgical site was copiously irrigated with saline until the effluent was clear. A 19-Fr JP drain was placed in the remnant gallbladder and brought out through the lateral subcostal port, and secured to the skin with 2-0 Nylon suture. The remaining ports were removed under direct visualization and the abdomen was desufflated. The specimen was extracted via the umbilical port site. The umbilical port site fascia was closed with a 0 vicryl suture. The skin at all port sites was closed with 4-0 monocryl subcuticular suture. Dermabond was applied.  The patient tolerated the procedure well with no apparent complications. All counts were correct x2 at the end of the procedure. The patient was extubated and taken to PACU in stable condition.  Michaelle Birks,  MD 07/16/22 2:32 PM

## 2022-07-16 NOTE — ED Notes (Signed)
Pt's last solid meal at 1600 on 07/15/2022 & last clear liquid was at midnight of 07/15/2022. Pt and family verbalized understanding of NPO status.

## 2022-07-16 NOTE — Anesthesia Preprocedure Evaluation (Addendum)
Anesthesia Evaluation  Patient identified by MRN, date of birth, ID band Patient awake    Reviewed: Allergy & Precautions, NPO status , Patient's Chart, lab work & pertinent test results  Airway Mallampati: II  TM Distance: >3 FB Neck ROM: Full    Dental no notable dental hx.    Pulmonary neg pulmonary ROS   Pulmonary exam normal        Cardiovascular negative cardio ROS  Rhythm:Regular Rate:Normal     Neuro/Psych negative neurological ROS  negative psych ROS   GI/Hepatic Neg liver ROS,GERD  Medicated,,Acute cholecystitis    Endo/Other  negative endocrine ROS    Renal/GU negative Renal ROS  negative genitourinary   Musculoskeletal negative musculoskeletal ROS (+)    Abdominal Normal abdominal exam  (+)   Peds  Hematology negative hematology ROS (+)   Anesthesia Other Findings   Reproductive/Obstetrics                             Anesthesia Physical Anesthesia Plan  ASA: 2  Anesthesia Plan: General   Post-op Pain Management:    Induction: Intravenous  PONV Risk Score and Plan: 2 and Ondansetron, Dexamethasone, Midazolam and Treatment may vary due to age or medical condition  Airway Management Planned: Mask and Oral ETT  Additional Equipment: None  Intra-op Plan:   Post-operative Plan: Extubation in OR  Informed Consent: I have reviewed the patients History and Physical, chart, labs and discussed the procedure including the risks, benefits and alternatives for the proposed anesthesia with the patient or authorized representative who has indicated his/her understanding and acceptance.     Dental advisory given  Plan Discussed with: CRNA  Anesthesia Plan Comments: (Lab Results      Component                Value               Date                      WBC                      13.3 (H)            07/16/2022                HGB                      12.5 (L)             07/16/2022                HCT                      37.8 (L)            07/16/2022                MCV                      89.4                07/16/2022                PLT                      508 (H)  07/16/2022             Lab Results      Component                Value               Date                      NA                       136                 07/16/2022                K                        3.8                 07/16/2022                CO2                      26                  07/16/2022                GLUCOSE                  120 (H)             07/16/2022                BUN                      16                  07/16/2022                CREATININE               1.33 (H)            07/16/2022                CALCIUM                  9.3                 07/16/2022                EGFR                     65                  11/08/2021                GFRNONAA                 >60                 07/16/2022           )       Anesthesia Quick Evaluation  

## 2022-07-17 ENCOUNTER — Encounter (HOSPITAL_COMMUNITY): Payer: Self-pay | Admitting: Surgery

## 2022-07-17 DIAGNOSIS — Z9049 Acquired absence of other specified parts of digestive tract: Secondary | ICD-10-CM | POA: Diagnosis not present

## 2022-07-17 DIAGNOSIS — K8000 Calculus of gallbladder with acute cholecystitis without obstruction: Secondary | ICD-10-CM | POA: Diagnosis present

## 2022-07-17 DIAGNOSIS — K828 Other specified diseases of gallbladder: Secondary | ICD-10-CM | POA: Diagnosis present

## 2022-07-17 DIAGNOSIS — K821 Hydrops of gallbladder: Secondary | ICD-10-CM | POA: Diagnosis present

## 2022-07-17 DIAGNOSIS — K810 Acute cholecystitis: Secondary | ICD-10-CM | POA: Diagnosis present

## 2022-07-17 DIAGNOSIS — K219 Gastro-esophageal reflux disease without esophagitis: Secondary | ICD-10-CM | POA: Diagnosis present

## 2022-07-17 DIAGNOSIS — K819 Cholecystitis, unspecified: Secondary | ICD-10-CM | POA: Diagnosis present

## 2022-07-17 DIAGNOSIS — K838 Other specified diseases of biliary tract: Secondary | ICD-10-CM | POA: Diagnosis present

## 2022-07-17 DIAGNOSIS — Z79899 Other long term (current) drug therapy: Secondary | ICD-10-CM | POA: Diagnosis not present

## 2022-07-17 LAB — CBC
HCT: 34.2 % — ABNORMAL LOW (ref 39.0–52.0)
Hemoglobin: 11.7 g/dL — ABNORMAL LOW (ref 13.0–17.0)
MCH: 30.3 pg (ref 26.0–34.0)
MCHC: 34.2 g/dL (ref 30.0–36.0)
MCV: 88.6 fL (ref 80.0–100.0)
Platelets: 449 10*3/uL — ABNORMAL HIGH (ref 150–400)
RBC: 3.86 MIL/uL — ABNORMAL LOW (ref 4.22–5.81)
RDW: 11.8 % (ref 11.5–15.5)
WBC: 11.9 10*3/uL — ABNORMAL HIGH (ref 4.0–10.5)
nRBC: 0 % (ref 0.0–0.2)

## 2022-07-17 LAB — BASIC METABOLIC PANEL
Anion gap: 13 (ref 5–15)
BUN: 13 mg/dL (ref 6–20)
CO2: 23 mmol/L (ref 22–32)
Calcium: 8.7 mg/dL — ABNORMAL LOW (ref 8.9–10.3)
Chloride: 101 mmol/L (ref 98–111)
Creatinine, Ser: 1.26 mg/dL — ABNORMAL HIGH (ref 0.61–1.24)
GFR, Estimated: >60 mL/min (ref >60)
Glucose, Bld: 162 mg/dL — ABNORMAL HIGH (ref 70–99)
Potassium: 4.1 mmol/L (ref 3.5–5.1)
Sodium: 137 mmol/L (ref 135–145)

## 2022-07-17 MED ORDER — DOCUSATE SODIUM 100 MG PO CAPS
100.0000 mg | ORAL_CAPSULE | Freq: Two times a day (BID) | ORAL | Status: DC
  Administered 2022-07-17 – 2022-07-19 (×5): 100 mg via ORAL
  Filled 2022-07-17 (×5): qty 1

## 2022-07-17 MED ORDER — POLYETHYLENE GLYCOL 3350 17 G PO PACK: 17.0000 g | PACK | Freq: Every day | ORAL | Status: DC | PRN

## 2022-07-17 NOTE — Progress Notes (Signed)
[facility] Progress Note  1 Day Post-Op  Subjective: CC-  Sore after surgery. Pain is different than prior to surgery. Pain is mostly at the drain site and incisions. Worse when he coughs or laughs. Some nausea, no emesis. Tolerating liquids and some crackers. Ambulating to restroom without issues. No BM.  Objective: Vital signs in last 24 hours: Temp:  [97.1 F (36.2 C)-99.4 F (37.4 C)] 97.3 F (36.3 C) (01/01 0806) Pulse Rate:  [67-154] 73 (01/01 0806) Resp:  [14-23] 16 (01/01 0508) BP: (101-115)/(70-78) 115/74 (01/01 0806) SpO2:  [91 %-99 %] 95 % (01/01 0806) Weight:  [99.3 kg] 99.3 kg (12/31 2149) Last BM Date : 07/16/22  Intake/Output from previous day: 12/31 0701 - 01/01 0700 In: 0932 [P.O.:240; I.V.:1400; IV Piggyback:39] Out: 430 [Urine:350; Drains:80] Intake/Output this shift: No intake/output data recorded.  PE: Gen:  Alert, NAD, pleasant Pulm: rate and effort normal Abd: Soft, ND, appropriately tender over incisions and drain, incisions C/D/I, drain with light bilious fluid in bulb  Lab Results:  Recent Labs    07/16/22 0210 07/17/22 0205  WBC 13.3* 11.9*  HGB 12.5* 11.7*  HCT 37.8* 34.2*  PLT 508* 449*   BMET Recent Labs    07/16/22 0210 07/17/22 0205  NA 136 137  K 3.8 4.1  CL 97* 101  CO2 26 23  GLUCOSE 120* 162*  BUN 16 13  CREATININE 1.33* 1.26*  CALCIUM 9.3 8.7*   PT/INR No results for input(s): "LABPROT", "INR" in the last 72 hours. CMP     Component Value Date/Time   NA 137 07/17/2022 0205   NA 143 11/08/2021 1412   K 4.1 07/17/2022 0205   CL 101 07/17/2022 0205   CO2 23 07/17/2022 0205   GLUCOSE 162 (H) 07/17/2022 0205   BUN 13 07/17/2022 0205   BUN 18 11/08/2021 1412   CREATININE 1.26 (H) 07/17/2022 0205   CALCIUM 8.7 (L) 07/17/2022 0205   PROT 6.9 07/16/2022 0210   PROT 6.7 11/08/2021 1412   ALBUMIN 3.3 (L) 07/16/2022 0210   ALBUMIN 4.4 11/08/2021 1412   AST 48 (H) 07/16/2022 0210   ALT 32 07/16/2022  0210   ALKPHOS 73 07/16/2022 0210   BILITOT 0.7 07/16/2022 0210   BILITOT 0.2 11/08/2021 1412   GFRNONAA >60 07/17/2022 0205   GFRAA  02/11/2010 1950    >60        The eGFR has been calculated using the MDRD equation. This calculation has not been validated in all clinical situations. eGFR's persistently <60 mL/min signify possible Chronic Kidney Disease.   Lipase     Component Value Date/Time   LIPASE 30 07/16/2022 0210       Studies/Results: US Abdomen Limited RUQ (LIVER/GB)  Result Date: 07/16/2022 CLINICAL DATA:  57 year old male with abnormal gallbladder on CT Abdomen and Pelvis 0424 hours today. EXAM: ULTRASOUND ABDOMEN LIMITED RIGHT UPPER QUADRANT COMPARISON:  CT Abdomen and Pelvis 0424 hours today. FINDINGS: Gallbladder: Gallbladder sludge and wall thickening ranging from 4 mm to 6 mm. Positive sonographic Murphy sign. No pericholecystic fluid. No obstructing gallstone identified. Common bile duct: Diameter: 4 mm, normal. Liver: No focal lesion identified. Within normal limits in parenchymal echogenicity. Portal vein is patent on color Doppler imaging with normal direction of blood flow towards the liver. Other: Negative visible right kidney.  No free fluid. IMPRESSION: 1. Acute Cholecystitis with sludge. Consider acalculous cholecystitis or occult obstructing gallstone. 2. No evidence of bile duct obstruction. Electronically Signed   By: Lemmie Evens  [place] Crane M.D.   On: 07/16/2022 07:39   CT ABDOMEN PELVIS W CONTRAST  Result Date: 07/16/2022 CLINICAL DATA:  Nausea, vomiting and diarrhea. EXAM: CT ABDOMEN AND PELVIS WITH CONTRAST TECHNIQUE: Multidetector CT imaging of the abdomen and pelvis was performed using the standard protocol following bolus administration of intravenous contrast. RADIATION DOSE REDUCTION: This exam was performed according to the departmental dose-optimization program which includes automated exposure control, adjustment of the mA and/or kV according to patient size  and/or use of iterative reconstruction technique. CONTRAST:  42m OMNIPAQUE IOHEXOL 350 MG/ML SOLN COMPARISON:  October 27, 2021 FINDINGS: Lower chest: No acute abnormality. Hepatobiliary: No focal liver abnormality is seen. The gallbladder is moderately distended with mild to moderate severity gallbladder wall thickening and pericholecystic inflammatory fat stranding. No gallstones are clearly identified. There is no evidence of biliary dilatation. Pancreas: Unremarkable. No pancreatic ductal dilatation or surrounding inflammatory changes. Spleen: Normal in size without focal abnormality. Adrenals/Urinary Tract: Adrenal glands are unremarkable. Kidneys are normal in size, without renal calculi or hydronephrosis. Stable bilateral parenchymal and parapelvic renal cysts are seen. Bladder is unremarkable. Stomach/Bowel: There is a small hiatal hernia. The appendix is surgically absent. No evidence of bowel wall thickening, distention, or inflammatory changes. Vascular/Lymphatic: No significant vascular findings are present. No enlarged abdominal or pelvic lymph nodes. Reproductive: Prostate is unremarkable. Other: No abdominal wall hernia or abnormality. No abdominopelvic ascites. Musculoskeletal: Degenerative changes are seen within the lumbar spine. This is most prominent at the levels of L3-L4 and L5-S1. IMPRESSION: 1. Findings consistent with acute cholecystitis. 2. Stable bilateral parenchymal and parapelvic renal cysts. No follow-up imaging is recommended. This recommendation follows ACR consensus guidelines: Management of the Incidental Renal Mass on CT: A White Paper of the ACR Incidental Findings Committee. J Am Coll Radiol [phone] 3. Small hiatal hernia. Electronically Signed   By: TVirgina NorfolkM.D.   On: 07/16/2022 04:42    Anti-infectives: Anti-infectives (From admission, onward)    Start     Dose/Rate Route Frequency Ordered Stop   07/16/22 1600  piperacillin-tazobactam (ZOSYN) IVPB 3.375  g        3.375 g 12.5 mL/hr over 240 Minutes Intravenous Every 8 hours 07/16/22 1431 07/17/22 0759   07/16/22 0800  cefTRIAXone (ROCEPHIN) 2 g in sodium chloride 0.9 % 100 mL IVPB  Status:  Discontinued        2 g 200 mL/hr over 30 Minutes Intravenous Every 24 hours 07/16/22 0758 07/16/22 0759   07/16/22 0700  piperacillin-tazobactam (ZOSYN) IVPB 3.375 g  Status:  Discontinued        3.375 g 12.5 mL/hr over 240 Minutes Intravenous Every 8 hours 07/16/22 0653 07/16/22 0758        Assessment/Plan  POD#1 s/p Laparoscopic subtotal fenestrating cholecystectomy with drain placement 12/31 Dr. AZenia Resides- scheduled tylenol and robaxin for pain, oxy PRN - completed IV abx - mobilize - drain with bilious fluid in bulb. Discussed with MD - will monitor volume of output over the next 24 hours. May need HIDA vs GI consult tomorrow  ID - zosyn 12/31>>1/1 FEN - SLIV, reg diet VTE - lovenox Foley - none and voiding    LOS: 0 days    BWellington Hampshire [facility]St[facility] 07/17/2022, 9:12 AM Please see Amion for pager number during day hours 7:00am-4:30pm

## 2022-07-17 NOTE — Progress Notes (Signed)
Patient ambulates to restroom without assistance, tolerates a regular diet well, as pain when he coughs or laughs, no vomiting or c/o nausea. JP drain output total  is 80 and dark brown. Plan of  care continues

## 2022-07-18 ENCOUNTER — Inpatient Hospital Stay (HOSPITAL_COMMUNITY): Payer: 59

## 2022-07-18 ENCOUNTER — Inpatient Hospital Stay (HOSPITAL_COMMUNITY): Payer: 59 | Admitting: Anesthesiology

## 2022-07-18 ENCOUNTER — Encounter (HOSPITAL_COMMUNITY): Admission: EM | Disposition: A | Discharge status: Home / Self Care | Payer: Self-pay | Source: Home / Self Care

## 2022-07-18 ENCOUNTER — Encounter (HOSPITAL_COMMUNITY): Payer: Self-pay

## 2022-07-18 DIAGNOSIS — K838 Other specified diseases of biliary tract: Secondary | ICD-10-CM

## 2022-07-18 HISTORY — PX: SPHINCTEROTOMY: SHX5544

## 2022-07-18 HISTORY — PX: ERCP: SHX5425

## 2022-07-18 HISTORY — PX: BILIARY STENT PLACEMENT: SHX5538

## 2022-07-18 LAB — CBC
HCT: 37.3 % — ABNORMAL LOW (ref 39.0–52.0)
Hemoglobin: 12.5 g/dL — ABNORMAL LOW (ref 13.0–17.0)
MCH: 30.3 pg (ref 26.0–34.0)
MCHC: 33.5 g/dL (ref 30.0–36.0)
MCV: 90.3 fL (ref 80.0–100.0)
Platelets: 551 10*3/uL — ABNORMAL HIGH (ref 150–400)
RBC: 4.13 MIL/uL — ABNORMAL LOW (ref 4.22–5.81)
RDW: 11.9 % (ref 11.5–15.5)
WBC: 13.4 10*3/uL — ABNORMAL HIGH (ref 4.0–10.5)
nRBC: 0 % (ref 0.0–0.2)

## 2022-07-18 LAB — COMPREHENSIVE METABOLIC PANEL
ALT: 207 U/L — ABNORMAL HIGH (ref 0–44)
AST: 91 U/L — ABNORMAL HIGH (ref 15–41)
Albumin: 3.2 g/dL — ABNORMAL LOW (ref 3.5–5.0)
Alkaline Phosphatase: 100 U/L (ref 38–126)
Anion gap: 12 (ref 5–15)
BUN: 13 mg/dL (ref 6–20)
CO2: 26 mmol/L (ref 22–32)
Calcium: 8.9 mg/dL (ref 8.9–10.3)
Chloride: 100 mmol/L (ref 98–111)
Creatinine, Ser: 1.32 mg/dL — ABNORMAL HIGH (ref 0.61–1.24)
GFR, Estimated: >60 mL/min (ref >60)
Glucose, Bld: 113 mg/dL — ABNORMAL HIGH (ref 70–99)
Potassium: 4.2 mmol/L (ref 3.5–5.1)
Sodium: 138 mmol/L (ref 135–145)
Total Bilirubin: 0.5 mg/dL (ref 0.3–1.2)
Total Protein: 6.7 g/dL (ref 6.5–8.1)

## 2022-07-18 SURGERY — ERCP, WITH INTERVENTION IF INDICATED
Anesthesia: General

## 2022-07-18 MED ORDER — PROPOFOL 10 MG/ML IV BOLUS
INTRAVENOUS | Status: DC | PRN
  Administered 2022-07-18: 150 mg via INTRAVENOUS

## 2022-07-18 MED ORDER — DICLOFENAC SUPPOSITORY 100 MG
RECTAL | Status: DC | PRN
  Administered 2022-07-18: 100 mg via RECTAL

## 2022-07-18 MED ORDER — LIDOCAINE 2% (20 MG/ML) 5 ML SYRINGE
INTRAMUSCULAR | Status: DC | PRN
  Administered 2022-07-18: 80 mg via INTRAVENOUS

## 2022-07-18 MED ORDER — LACTATED RINGERS IV SOLN: INTRAVENOUS | Status: DC | PRN

## 2022-07-18 MED ORDER — BISACODYL 10 MG RE SUPP
10.0000 mg | Freq: Once | RECTAL | Status: AC
  Administered 2022-07-18: 10 mg via RECTAL
  Filled 2022-07-18: qty 1

## 2022-07-18 MED ORDER — FENTANYL CITRATE (PF) 100 MCG/2ML IJ SOLN: 25.0000 ug | INTRAMUSCULAR | Status: DC | PRN

## 2022-07-18 MED ORDER — CIPROFLOXACIN IN D5W 400 MG/200ML IV SOLN
INTRAVENOUS | Status: DC | PRN
  Administered 2022-07-18: 400 mg via INTRAVENOUS

## 2022-07-18 MED ORDER — FENTANYL CITRATE (PF) 100 MCG/2ML IJ SOLN
INTRAMUSCULAR | Status: AC
  Filled 2022-07-18: qty 2

## 2022-07-18 MED ORDER — CIPROFLOXACIN IN D5W 400 MG/200ML IV SOLN
INTRAVENOUS | Status: AC
  Filled 2022-07-18: qty 200

## 2022-07-18 MED ORDER — ONDANSETRON HCL 4 MG/2ML IJ SOLN
INTRAMUSCULAR | Status: DC | PRN
  Administered 2022-07-18: 4 mg via INTRAVENOUS

## 2022-07-18 MED ORDER — ROCURONIUM BROMIDE 10 MG/ML (PF) SYRINGE
PREFILLED_SYRINGE | INTRAVENOUS | Status: DC | PRN
  Administered 2022-07-18: 60 mg via INTRAVENOUS

## 2022-07-18 MED ORDER — DICLOFENAC SUPPOSITORY 100 MG
RECTAL | Status: AC
  Filled 2022-07-18: qty 1

## 2022-07-18 MED ORDER — SODIUM CHLORIDE 0.9 % IV SOLN
INTRAVENOUS | Status: DC | PRN
  Administered 2022-07-18: 20 mL

## 2022-07-18 MED ORDER — SUGAMMADEX SODIUM 200 MG/2ML IV SOLN
INTRAVENOUS | Status: DC | PRN
  Administered 2022-07-18: 200 mg via INTRAVENOUS

## 2022-07-18 MED ORDER — LACTATED RINGERS IV SOLN
INTRAVENOUS | Status: AC | PRN
  Administered 2022-07-18: 20 mL/h via INTRAVENOUS

## 2022-07-18 MED ORDER — GLUCAGON HCL RDNA (DIAGNOSTIC) 1 MG IJ SOLR
INTRAMUSCULAR | Status: AC
  Filled 2022-07-18: qty 1

## 2022-07-18 MED ORDER — SODIUM CHLORIDE 0.9 % IV SOLN: INTRAVENOUS | Status: DC

## 2022-07-18 MED ORDER — FENTANYL CITRATE (PF) 250 MCG/5ML IJ SOLN
INTRAMUSCULAR | Status: DC | PRN
  Administered 2022-07-18: 100 ug via INTRAVENOUS

## 2022-07-18 MED ORDER — DEXAMETHASONE SODIUM PHOSPHATE 10 MG/ML IJ SOLN
INTRAMUSCULAR | Status: DC | PRN
  Administered 2022-07-18: 10 mg via INTRAVENOUS

## 2022-07-18 NOTE — Progress Notes (Addendum)
[facility] Progress Note  2 Days Post-Op  Subjective: CC-  Reports bloating, right sided abdominal pain, significant leakage around his JP.   Objective: Vital signs in last 24 hours: Temp:  [97.3 F (36.3 C)-98.8 F (37.1 C)] 98.8 F (37.1 C) (01/02 0759) Pulse Rate:  [72-84] 84 (01/02 0759) Resp:  [14-19] 18 (01/02 0759) BP: (107-136)/(68-84) 112/74 (01/02 0759) SpO2:  [95 %-98 %] 95 % (01/02 0759) Last BM Date : 07/16/22  Intake/Output from previous day: 01/01 0701 - 01/02 0700 In: 120 [P.O.:120] Out: 88 [Drains:88] Intake/Output this shift: No intake/output data recorded.  PE: Gen:  Alert, NAD, pleasant Pulm: rate and effort normal Abd: Soft, distended, appropriately tender over incisions and drain, incisions C/D/I, drain with 80cc frankly bilious fluid in bulb and copious bilious drainage on dressing and gown  Lab Results:  Recent Labs    07/17/22 0205 07/18/22 0327  WBC 11.9* 13.4*  HGB 11.7* 12.5*  HCT 34.2* 37.3*  PLT 449* 551*    BMET Recent Labs    07/17/22 0205 07/18/22 0327  NA 137 138  K 4.1 4.2  CL 101 100  CO2 23 26  GLUCOSE 162* 113*  BUN 13 13  CREATININE 1.26* 1.32*  CALCIUM 8.7* 8.9    PT/INR No results for input(s): "LABPROT", "INR" in the last 72 hours. CMP     Component Value Date/Time   NA 138 07/18/2022 0327   NA 143 11/08/2021 1412   K 4.2 07/18/2022 0327   CL 100 07/18/2022 0327   CO2 26 07/18/2022 0327   GLUCOSE 113 (H) 07/18/2022 0327   BUN 13 07/18/2022 0327   BUN 18 11/08/2021 1412   CREATININE 1.32 (H) 07/18/2022 0327   CALCIUM 8.9 07/18/2022 0327   PROT 6.7 07/18/2022 0327   PROT 6.7 11/08/2021 1412   ALBUMIN 3.2 (L) 07/18/2022 0327   ALBUMIN 4.4 11/08/2021 1412   AST 91 (H) 07/18/2022 0327   ALT 207 (H) 07/18/2022 0327   ALKPHOS 100 07/18/2022 0327   BILITOT 0.5 07/18/2022 0327   BILITOT 0.2 11/08/2021 1412   GFRNONAA >60 07/18/2022 0327   GFRAA  02/11/2010 1950    >60        The eGFR  has been calculated using the MDRD equation. This calculation has not been validated in all clinical situations. eGFR's persistently <60 mL/min signify possible Chronic Kidney Disease.   Lipase     Component Value Date/Time   LIPASE 30 07/16/2022 0210       Studies/Results: No results found.  Anti-infectives: Anti-infectives (From admission, onward)    Start     Dose/Rate Route Frequency Ordered Stop   07/16/22 1600  piperacillin-tazobactam (ZOSYN) IVPB 3.375 g        3.375 g 12.5 mL/hr over 240 Minutes Intravenous Every 8 hours 07/16/22 1431 07/17/22 0759   07/16/22 0800  cefTRIAXone (ROCEPHIN) 2 g in sodium chloride 0.9 % 100 mL IVPB  Status:  Discontinued        2 g 200 mL/hr over 30 Minutes Intravenous Every 24 hours 07/16/22 0758 07/16/22 0759   07/16/22 0700  piperacillin-tazobactam (ZOSYN) IVPB 3.375 g  Status:  Discontinued        3.375 g 12.5 mL/hr over 240 Minutes Intravenous Every 8 hours 07/16/22 0653 07/16/22 0758        Assessment/Plan  POD#3 s/p Laparoscopic subtotal fenestrating cholecystectomy with drain placement 12/31 Dr. Zenia Resides - scheduled tylenol and robaxin for pain, oxy PRN - completed IV abx -  mobilize - bile leak. Recorded volume is erroneously low given leakage around drain tubing. Will c/s GI for possible ERCP. Spoke with Dr. Collene Mares at 08:40.   ID - zosyn 12/31>>1/1 FEN - SLIV, reg diet- NPO now, last PO intake was 0200 VTE - lovenox Foley - none and voiding    LOS: 1 day    Clovis Riley, [facility] 07/18/2022, 8:05 AM Please see Amion for pager number during day hours 7:00am-4:30pm

## 2022-07-18 NOTE — Transfer of Care (Signed)
Immediate Anesthesia Transfer of Care Note  Patient: SHAWNEE HIGHAM  Procedure(s) Performed: ENDOSCOPIC RETROGRADE CHOLANGIOPANCREATOGRAPHY (ERCP) SPHINCTEROTOMY BILIARY STENT PLACEMENT  Patient Location: Endoscopy Unit  Anesthesia Type:General  Level of Consciousness: drowsy and patient cooperative  Airway & Oxygen Therapy: Patient Spontanous Breathing  Post-op Assessment: Report given to RN and Post -op Vital signs reviewed and stable  Post vital signs: stable  Last Vitals:  Vitals Value Taken Time  BP    Temp    Pulse 86 07/18/22 1647  Resp 18 07/18/22 1647  SpO2 93 % 07/18/22 1647  Vitals shown include unvalidated device data.  Last Pain:  Vitals:   07/18/22 1418  TempSrc: Temporal  PainSc: 4          Complications: No notable events documented.

## 2022-07-18 NOTE — Consult Note (Signed)
Reason for Consult: Bile Leak Referring Physician: CCS  Shelba Flake HPI: This is a 57 year old male with a PMH of GERD s/p laparoscopic subtotal fenestrating cholecystectomy for an acute cholecystitis.  The patient initially presented on 07/16/2022 with acute severe abdominal pain, nausea, and vomiting.  The pain did spontaneously improve, but it did not resolve.  The pain then acutely worsened the day before admission and it was associated with subjective cholls.  Upon presentation the CT scan of the abdomen was consistent with an acute cholecystitis.  The surgery was difficult and he required a subtotal cholecystectomy.  Postoperatively he continued to have a large amount of bilious drainage and it was determined, clinically, that the patient had a bile leak.  GI was consulted for further evaluation and treatment.  Past Medical History:  Diagnosis Date   GERD (gastroesophageal reflux disease)     Past Surgical History:  Procedure Laterality Date   APPENDECTOMY     CHOLECYSTECTOMY N/A 07/16/2022   Procedure: LAPAROSCOPIC CHOLECYSTECTOMY;  Surgeon: Dwan Bolt, MD;  Location: [facility];  Service: General;  Laterality: N/A;   finger reattachment Left    SHOULDER ARTHROSCOPY Left     History reviewed. No pertinent family history.  Social History:  reports that he has never smoked. He has never used smokeless tobacco. He reports current alcohol use. He reports that he does not use drugs.  Allergies: No Known Allergies  Medications: Scheduled:  [MAR Hold] acetaminophen  650 mg Oral QID   [MAR Hold] docusate sodium  100 mg Oral BID   [MAR Hold] methocarbamol  500 mg Oral QID   Continuous:  sodium chloride     lactated ringers 20 mL/hr (07/18/22 1423)    Results for orders placed or performed during the hospital encounter of 07/16/22 (from the past 24 hour(s))  CBC     Status: Abnormal   Collection Time: 07/18/22  3:27 AM  Result Value Ref Range   WBC 13.4 (H) 4.0 - 10.5 K/uL    RBC 4.13 (L) 4.22 - 5.81 MIL/uL   Hemoglobin 12.5 (L) 13.0 - 17.0 g/dL   HCT 37.3 (L) 39.0 - 52.0 %   MCV 90.3 80.0 - 100.0 fL   MCH 30.3 26.0 - 34.0 pg   MCHC 33.5 30.0 - 36.0 g/dL   RDW 11.9 11.5 - 15.5 %   Platelets 551 (H) 150 - 400 K/uL   nRBC 0.0 0.0 - 0.2 %  Comprehensive metabolic panel     Status: Abnormal   Collection Time: 07/18/22  3:27 AM  Result Value Ref Range   Sodium 138 135 - 145 mmol/L   Potassium 4.2 3.5 - 5.1 mmol/L   Chloride 100 98 - 111 mmol/L   CO2 26 22 - 32 mmol/L   Glucose, Bld 113 (H) 70 - 99 mg/dL   BUN 13 6 - 20 mg/dL   Creatinine, Ser 1.32 (H) 0.61 - 1.24 mg/dL   Calcium 8.9 8.9 - 10.3 mg/dL   Total Protein 6.7 6.5 - 8.1 g/dL   Albumin 3.2 (L) 3.5 - 5.0 g/dL   AST 91 (H) 15 - 41 U/L   ALT 207 (H) 0 - 44 U/L   Alkaline Phosphatase 100 38 - 126 U/L   Total Bilirubin 0.5 0.3 - 1.2 mg/dL   GFR, Estimated >60 >60 mL/min   Anion gap 12 5 - 15     No results found.  ROS:  As stated above in the HPI otherwise negative.  Blood pressure 119/78, pulse 70, temperature 98 F (36.7 C), temperature source Temporal, resp. rate 17, height '5\' 11"'$  (1.803 m), weight 99.3 kg, SpO2 94 %.    PE: Gen: NAD, Alert and Oriented HEENT:  [place]/AT, EOMI Neck: Supple, no LAD Lungs: CTA Bilaterally CV: RRR without M/G/R ABD: Soft, NTND, +BS Ext: No C/C/E  Assessment/Plan: 1) Bile leak. 2) S/p laparoscopic subtotal fenestrating cholecystecomy. 3) Right sided pain.   The patient's bile drainage is high.  Over the past two days it was recorded that he was draining 518 ml per day.  An ERCP with stent placement will be performed.  The procedure was described in detail to the patient and the risk of pancreatitis was discussed.  Plan: 1) ERCP with stent placement.  Gustave Lindeman D 07/18/2022, 2:51 PM

## 2022-07-18 NOTE — Anesthesia Preprocedure Evaluation (Addendum)
Anesthesia Evaluation  Patient identified by MRN, date of birth, ID band Patient awake    Reviewed: Allergy & Precautions, NPO status , Patient's Chart, lab work & pertinent test results  Airway Mallampati: III  TM Distance: >3 FB Neck ROM: Full    Dental no notable dental hx. (+) Teeth Intact, Dental Advisory Given   Pulmonary neg pulmonary ROS   Pulmonary exam normal breath sounds clear to auscultation       Cardiovascular negative cardio ROS Normal cardiovascular exam Rhythm:Regular Rate:Normal     Neuro/Psych negative neurological ROS  negative psych ROS   GI/Hepatic Neg liver ROS,GERD  ,,  Endo/Other  negative endocrine ROS    Renal/GU negative Renal ROS  negative genitourinary   Musculoskeletal negative musculoskeletal ROS (+)    Abdominal   Peds  Hematology negative hematology ROS (+)   Anesthesia Other Findings POD#3 s/p Laparoscopic subtotal fenestrating cholecystectomy with drain placement 12/31  Reproductive/Obstetrics                             Anesthesia Physical Anesthesia Plan  ASA: 2  Anesthesia Plan: General   Post-op Pain Management:    Induction: Intravenous  PONV Risk Score and Plan: 2 and Midazolam, Dexamethasone and Ondansetron  Airway Management Planned: Oral ETT  Additional Equipment:   Intra-op Plan:   Post-operative Plan: Extubation in OR  Informed Consent: I have reviewed the patients History and Physical, chart, labs and discussed the procedure including the risks, benefits and alternatives for the proposed anesthesia with the patient or authorized representative who has indicated his/her understanding and acceptance.     Dental advisory given  Plan Discussed with: CRNA  Anesthesia Plan Comments:        Anesthesia Quick Evaluation

## 2022-07-18 NOTE — Anesthesia Procedure Notes (Signed)
Procedure Name: Intubation Date/Time: 07/18/2022 3:20 PM  Performed by: Mosetta Pigeon, CRNAPre-anesthesia Checklist: Patient identified, Emergency Drugs available, Suction available and Patient being monitored Patient Re-evaluated:Patient Re-evaluated prior to induction Oxygen Delivery Method: Circle System Utilized Preoxygenation: Pre-oxygenation with 100% oxygen Induction Type: IV induction Ventilation: Mask ventilation without difficulty Laryngoscope Size: Mac and 4 Grade View: Grade I Tube type: Oral Tube size: 7.5 mm Number of attempts: 1 Airway Equipment and Method: Stylet Placement Confirmation: ETT inserted through vocal cords under direct vision, positive ETCO2 and breath sounds checked- equal and bilateral Secured at: 23 cm Tube secured with: Tape Dental Injury: Teeth and Oropharynx as per pre-operative assessment

## 2022-07-18 NOTE — Op Note (Signed)
[facility] Patient Name: Dakota [name] Procedure Date : 07/18/2022 MRN: [id number] Attending MD: Carol Ada , MD, [id number] Date of Birth: 1966/03/09 CSN: [id number] Age: 57 Admit Type: Inpatient Procedure:                ERCP Indications:              Bile leak Providers:                Carol Ada, MD, Glori Bickers, RN, Fransico Setters                            [place], Technician Referring MD:              Medicines:                General Anesthesia Complications:            No immediate complications. Estimated Blood Loss:     Estimated blood loss: none. Procedure:                Pre-Anesthesia Assessment:                           - Prior to the procedure, a History and Physical                            was performed, and patient medications and                            allergies were reviewed. The patient's tolerance of                            previous anesthesia was also reviewed. The risks                            and benefits of the procedure and the sedation                            options and risks were discussed with the patient.                            All questions were answered, and informed consent                            was obtained. Prior Anticoagulants: The patient has                            taken no anticoagulant or antiplatelet agents. ASA                            Grade Assessment: II - A patient with mild systemic                            disease. After reviewing the risks and benefits,                            the  patient was deemed in satisfactory condition to                            undergo the procedure.                           - Sedation was administered by an anesthesia                            professional. General anesthesia was attained.                           After obtaining informed consent, the scope was                            passed under direct vision. Throughout the                             procedure, the patient's blood pressure, pulse, and                            oxygen saturations were monitored continuously. The                            [id number]-[id number] ([id number]) Olympus duodenoscope was                            introduced through the mouth, and used to inject                            contrast into and used to inject contrast into the                            bile duct and ventral pancreatic duct. The ERCP was                            technically difficult and complex. The patient                            tolerated the procedure well. Scope In: Scope Out: Findings:      The major papilla was adjacent to a diverticulum. The major papilla was       normal. The bile duct was deeply cannulated with the short-nosed       traction sphincterotome. Contrast was injected. I personally interpreted       the bile duct images. There was brisk flow of contrast through the       ducts. Image quality was excellent. Contrast extended to the entire       biliary tree. Extravasation of contrast originating from the gallbladder       was observed. The Revolution guidewire was passed into the biliary tree.       A 10 mm biliary sphincterotomy was made with a traction (standard)       sphincterotome using ERBE electrocautery. There was no       post-sphincterotomy bleeding. One 8.5 Fr by 5  cm plastic stent with a       single external flap and a single internal flap was placed 4 cm into the       common bile duct. Bile flowed through the stent. The stent was in good       position.      A moderately-sized periampullary diverticulum was identified. Some       vegetable matter was noted in the diverticulum. There was no spontaneous       drainage of bile from the ampulla. Cannulation was difficult. The       proximal ventral PD was cannulated multiple times, but with multiple       repositionings the Revolution wire was able to cannulated the CBD. The       wire was advanced to the  left intrahepatic ducts. Contrast injection       opacified the entire biliary tree. There was some contrast blush at the       JP drainage site. It was not clear if there was a clear leak. Radiology       was consulted during the procedure and there was suspicion of a leak.       With fluoroscopy no clear evidence of contrast was noted in the bulb,       however, with the suspicion of a leak on fluoroscopy the decision was       made to place a stent. A 1 cm sphincterotomy was created and this       allowed for free flow of bile. An 8.5 Fr x 5 cm stent was inserted and       there was further drainage of bile and contrast. The proximal portion of       the stent was above the blush area as noted by fluoroscopy. Also, before       the cannulation of the CBD, there was evidence of good pancreatic juice       drainage from the ampulla. Impression:               - The major papilla was adjacent to a diverticulum.                           - The major papilla appeared normal.                           - A bile leak was found.                           - A biliary sphincterotomy was performed.                           - One plastic stent was placed into the common bile                            duct. Recommendation:           - Return patient to hospital ward for ongoing care.                           - Resume regular diet.                           -  Monitor JP drain.                           - Monitor or any evidence of Post-ERCP pancreatitis. Procedure Code(s):        --- Professional ---                           [phone], Endoscopic retrograde                            cholangiopancreatography (ERCP); with placement of                            endoscopic stent into biliary or pancreatic duct,                            including pre- and post-dilation and guide wire                            passage, when performed, including sphincterotomy,                            when performed,  each stent                           [id number], Endoscopic catheterization of the biliary                            ductal system, radiological supervision and                            interpretation Diagnosis Code(s):        --- Professional ---                           K83.9, Disease of biliary tract, unspecified                           K83.8, Other specified diseases of biliary tract CPT copyright 2022 [employer] Association. All rights reserved. The codes documented in this report are preliminary and upon coder review may  be revised to meet current compliance requirements. Carol Ada, MD Carol Ada, MD 07/18/2022 4:53:08 PM This report has been signed electronically. Number of Addenda: 0

## 2022-07-19 ENCOUNTER — Other Ambulatory Visit (HOSPITAL_COMMUNITY): Payer: Self-pay

## 2022-07-19 ENCOUNTER — Encounter (HOSPITAL_COMMUNITY): Payer: Self-pay | Admitting: Gastroenterology

## 2022-07-19 LAB — COMPREHENSIVE METABOLIC PANEL
ALT: 121 U/L — ABNORMAL HIGH (ref 0–44)
AST: 30 U/L (ref 15–41)
Albumin: 2.8 g/dL — ABNORMAL LOW (ref 3.5–5.0)
Alkaline Phosphatase: 87 U/L (ref 38–126)
Anion gap: 9 (ref 5–15)
BUN: 13 mg/dL (ref 6–20)
CO2: 25 mmol/L (ref 22–32)
Calcium: 8.6 mg/dL — ABNORMAL LOW (ref 8.9–10.3)
Chloride: 103 mmol/L (ref 98–111)
Creatinine, Ser: 1.08 mg/dL (ref 0.61–1.24)
GFR, Estimated: >60 mL/min (ref >60)
Glucose, Bld: 111 mg/dL — ABNORMAL HIGH (ref 70–99)
Potassium: 4.1 mmol/L (ref 3.5–5.1)
Sodium: 137 mmol/L (ref 135–145)
Total Bilirubin: 0.5 mg/dL (ref 0.3–1.2)
Total Protein: 6.2 g/dL — ABNORMAL LOW (ref 6.5–8.1)

## 2022-07-19 LAB — SURGICAL PATHOLOGY

## 2022-07-19 MED ORDER — ACETAMINOPHEN 500 MG PO TABS: 1000.0000 mg | ORAL_TABLET | Freq: Four times a day (QID) | ORAL | Status: DC | PRN

## 2022-07-19 MED ORDER — METHOCARBAMOL 500 MG PO TABS
500.0000 mg | ORAL_TABLET | Freq: Four times a day (QID) | ORAL | 0 refills | Status: DC | PRN
  Filled 2022-07-19: qty 30, 8d supply, fill #0

## 2022-07-19 MED ORDER — OXYCODONE HCL 5 MG PO TABS
5.0000 mg | ORAL_TABLET | ORAL | 0 refills | Status: DC | PRN
  Filled 2022-07-19: qty 15, 3d supply, fill #0

## 2022-07-19 NOTE — Anesthesia Postprocedure Evaluation (Signed)
Anesthesia Post Note  Patient: Dakota [name]  Procedure(s) Performed: ENDOSCOPIC RETROGRADE CHOLANGIOPANCREATOGRAPHY (ERCP) [place]     Patient location during evaluation: PACU Anesthesia Type: General Level of consciousness: awake and alert Pain management: pain level controlled Vital Signs Assessment: post-procedure vital signs reviewed and stable Respiratory status: spontaneous breathing, nonlabored ventilation, respiratory function stable and patient connected to nasal cannula oxygen Cardiovascular status: blood pressure returned to baseline and stable Postop Assessment: no apparent nausea or vomiting Anesthetic complications: no   No notable events documented.            Effie Berkshire

## 2022-07-19 NOTE — Discharge Instructions (Signed)
[facility] ° °Please arrive at least 30 min before your appointment to complete your check in paperwork.  If you are unable to arrive 30 min prior to your appointment time we may have to cancel or reschedule you. °LAPAROSCOPIC SURGERY: POST OP INSTRUCTIONS °Always review your discharge instruction sheet given to you by the facility where your surgery was performed. °IF YOU HAVE DISABILITY OR FAMILY LEAVE FORMS, YOU MUST BRING THEM TO THE OFFICE FOR PROCESSING.   °DO NOT GIVE THEM TO YOUR DOCTOR. ° °PAIN CONTROL ° °First take acetaminophen (Tylenol) AND/or ibuprofen (Advil) to control your pain after surgery.  Follow directions on package.  Taking acetaminophen (Tylenol) and/or ibuprofen (Advil) regularly after surgery will help to control your pain and lower the amount of prescription pain medication you may need.  You should not take more than 4,000 mg (4 grams) of acetaminophen (Tylenol) in 24 hours.  You should not take ibuprofen (Advil), aleve, motrin, naprosyn or other NSAIDS if you have a history of stomach ulcers or chronic kidney disease.  °A prescription for pain medication may be given to you upon discharge.  Take your pain medication as prescribed, if you still have uncontrolled pain after taking acetaminophen (Tylenol) or ibuprofen (Advil). °Use ice packs to help control pain. °If you need a refill on your pain medication, please contact your pharmacy.  They will contact our office to request authorization. Prescriptions will not be filled after 5pm or on week-ends. ° °HOME MEDICATIONS °Take your usually prescribed medications unless otherwise directed. ° °DIET °You should follow a light diet the first few days after arrival home.  Be sure to include lots of fluids daily. Avoid fatty, fried foods.  ° °CONSTIPATION °It is common to experience some constipation after surgery and if you are taking pain medication.  Increasing fluid intake and taking a stool softener (such as Colace)  will usually help or prevent this problem from occurring.  A mild laxative (Milk of Magnesia or Miralax) should be taken according to package instructions if there are no bowel movements after 48 hours. ° °WOUND/INCISION CARE °Most patients will experience some swelling and bruising in the area of the incisions.  Ice packs will help.  Swelling and bruising can take several days to resolve.  °Unless discharge instructions indicate otherwise, follow guidelines below  °STERI-STRIPS - you may remove your outer bandages 48 hours after surgery, and you may shower at that time.  You have steri-strips (small skin tapes) in place directly over the incision.  These strips should be left on the skin for 7-10 days.   °DERMABOND/SKIN GLUE - you may shower in 24 hours.  The glue will flake off over the next 2-3 weeks. °Any sutures or staples will be removed at the office during your follow-up visit. ° °ACTIVITIES °You may resume regular (light) daily activities beginning the next day--such as daily self-care, walking, climbing stairs--gradually increasing activities as tolerated.  You may have sexual intercourse when it is comfortable.  Refrain from any heavy lifting or straining until approved by your doctor. °You may drive when you are no longer taking prescription pain medication, you can comfortably wear a seatbelt, and you can safely maneuver your car and apply brakes. ° °FOLLOW-UP °You should see your doctor in the office for a follow-up appointment approximately 2-3 weeks after your surgery.  You should have been given your post-op/follow-up appointment when your surgery was scheduled.  If you did not receive a post-op/follow-up appointment, make sure   that you call for this appointment within a day or two after you arrive home to insure a convenient appointment time. ° ° °WHEN TO CALL YOUR DOCTOR: °Fever over 101.0 °Inability to urinate °Continued bleeding from incision. °Increased pain, redness, or drainage from the  incision. °Increasing abdominal pain ° °The clinic staff is available to answer your questions during regular business hours.  Please don’t hesitate to call and ask to speak to one of the nurses for clinical concerns.  If you have a medical emergency, go to the nearest emergency room or call 911.  A surgeon from [facility] is always on call at the hospital. °[address], [address], [place], [place]  [address] ? [address] [address], [place], [place]   [address] °[phone] ? [phone] ? FAX [phone] ° ° ° ° °Managing Your Pain After Surgery Without Opioids ° ° ° °Thank you for participating in our program to help patients manage their pain after surgery without opioids. This is part of our effort to provide you with the best care possible, without exposing you or your family to the risk that opioids pose. ° °What pain can I expect after surgery? °You can expect to have some pain after surgery. This is normal. The pain is typically worse the day after surgery, and quickly begins to get better. °Many studies have found that many patients are able to manage their pain after surgery with Over-the-Counter (OTC) medications such as Tylenol and Motrin. If you have a condition that does not allow you to take Tylenol or Motrin, notify your surgical team. ° °How will I manage my pain? °The best strategy for controlling your pain after surgery is around the clock pain control with Tylenol (acetaminophen) and Motrin (ibuprofen or Advil). Alternating these medications with each other allows you to maximize your pain control. In addition to Tylenol and Motrin, you can use heating pads or ice packs on your incisions to help reduce your pain. ° °How will I alternate your regular strength over-the-counter pain medication? °You will take a dose of pain medication every three hours. °Start by taking 650 mg of Tylenol (2 pills of 325 mg) °3 hours later take 600 mg of Motrin (3 pills of 200 mg) °3 hours after  taking the Motrin take 650 mg of Tylenol °3 hours after that take 600 mg of Motrin. ° ° °- 1 - ° °See example - if your first dose of Tylenol is at 12:00 PM ° ° °12:00 PM Tylenol 650 mg (2 pills of 325 mg)  °3:00 PM Motrin 600 mg (3 pills of 200 mg)  °6:00 PM Tylenol 650 mg (2 pills of 325 mg)  °9:00 PM Motrin 600 mg (3 pills of 200 mg)  °Continue alternating every 3 hours  ° °We recommend that you follow this schedule around-the-clock for at least 3 days after surgery, or until you feel that it is no longer needed. Use the table on the last page of this handout to keep track of the medications you are taking. °Important: °Do not take more than 3000mg of Tylenol or 3200mg of Motrin in a 24-hour period. °Do not take ibuprofen/Motrin if you have a history of bleeding stomach ulcers, severe kidney disease, &/or actively taking a blood thinner ° °What if I still have pain? °If you have pain that is not controlled with the over-the-counter pain medications (Tylenol and Motrin or Advil) you might have what we call “breakthrough” pain. You will receive a prescription   for a small amount of an opioid pain medication such as Oxycodone, Tramadol, or Tylenol with Codeine. Use these opioid pills in the first 24 hours after surgery if you have breakthrough pain. Do not take more than 1 pill every 4-6 hours. ° °If you still have uncontrolled pain after using all opioid pills, don't hesitate to call our staff using the number provided. We will help make sure you are managing your pain in the best way possible, and if necessary, we can provide a prescription for additional pain medication. ° ° °Day 1   ° °Time  °Name of Medication Number of pills taken  °Amount of Acetaminophen  °Pain Level  ° °Comments  °AM PM       °AM PM       °AM PM       °AM PM       °AM PM       °AM PM       °AM PM       °AM PM       °Total Daily amount of Acetaminophen °Do not take more than  3,000 mg per day    ° ° °Day 2   ° °Time  °Name of Medication  Number of pills °taken  °Amount of Acetaminophen  °Pain Level  ° °Comments  °AM PM       °AM PM       °AM PM       °AM PM       °AM PM       °AM PM       °AM PM       °AM PM       °Total Daily amount of Acetaminophen °Do not take more than  3,000 mg per day    ° ° °Day 3   ° °Time  °Name of Medication Number of pills taken  °Amount of Acetaminophen  °Pain Level  ° °Comments  °AM PM       °AM PM       °AM PM       °AM PM       ° ° ° °AM PM       °AM PM       °AM PM       °AM PM       °Total Daily amount of Acetaminophen °Do not take more than  3,000 mg per day    ° ° °Day 4   ° °Time  °Name of Medication Number of pills taken  °Amount of Acetaminophen  °Pain Level  ° °Comments  °AM PM       °AM PM       °AM PM       °AM PM       °AM PM       °AM PM       °AM PM       °AM PM       °Total Daily amount of Acetaminophen °Do not take more than  3,000 mg per day    ° ° °Day 5   ° °Time  °Name of Medication Number °of pills taken  °Amount of Acetaminophen  °Pain Level  ° °Comments  °AM PM       °AM PM       °AM PM       °AM PM       °AM PM       °AM   PM       °AM PM       °AM PM       °Total Daily amount of Acetaminophen °Do not take more than  3,000 mg per day    ° ° ° °Day 6   ° °Time  °Name of Medication Number of pills °taken  °Amount of Acetaminophen  °Pain Level  °Comments  °AM PM       °AM PM       °AM PM       °AM PM       °AM PM       °AM PM       °AM PM       °AM PM       °Total Daily amount of Acetaminophen °Do not take more than  3,000 mg per day    ° ° °Day 7   ° °Time  °Name of Medication Number of pills taken  °Amount of Acetaminophen  °Pain Level  ° °Comments  °AM PM       °AM PM       °AM PM       °AM PM       °AM PM       °AM PM       °AM PM       °AM PM       °Total Daily amount of Acetaminophen °Do not take more than  3,000 mg per day    ° ° ° ° °For additional information about how and where to safely dispose of unused opioid °medications - https://www.morepowerfulnc.org ° °Disclaimer: This document  contains information and/or instructional materials adapted from [facility] for the typical patient with your condition. It does not replace medical advice from your health care provider because your experience may differ from that of the °typical patient. Talk to your health care provider if you have any questions about this °document, your condition or your treatment plan. °Adapted from [facility] ° °

## 2022-07-19 NOTE — Discharge Summary (Addendum)
Patient ID: Dakota [name] [id number] 1966-01-19 57 y.o.  Admit date: 07/16/2022 Discharge date: 07/19/2022  Admitting Diagnosis: Acute cholecystitis  Discharge Diagnosis Patient Active Problem List   Diagnosis Date Noted   Cholecystitis 07/17/2022   Cholecystitis, acute 07/16/2022   Gastroesophageal reflux disease without esophagitis 11/08/2021   Class 1 obesity due to excess calories with body mass index (BMI) of 32.0 to 32.9 in adult 11/08/2021  Bile leak, s/p ERCP  Consultants Dr. Carol Ada, GI  Reason for Admission: Dakota [name] is a 57 yo male who presented to the ED with acute onset abdominal pain, nausea and vomiting. About a week ago he began having nausea, vomiting and diarrhea that was attributed to food poisoning. This continued for several days and was starting to get better, however yesterday he started having severe epigastric abdominal pain. This worsened and he has had vomiting and diarrhea all night. He also endorses subjective chills. Labs in the ED show a mild leukocytosis. LFTs and lipase are normal. A CT scan showed cholecystitis. A RUQ [place] was then done and also showed cholecystitis with sludge in the gallbladder. General surgery was consulted.   He is overall in good health. His only prior abdominal surgery is a lap appendectomy about 10-15 years ago.  Procedures Dr. Zenia Resides, 07/16/22 Laparoscopic subtotal fenestrating cholecystectomy with drain placement  Dr. Benson Norway, 07/18/22 ERCP with stent placement   Hospital Course:  The patient was admitted and given a pre-op dose of abx therapy.  He underwent the above procedure.  He developed a post op bile leak, which was not unexpected given the fenestrated technique.  GI was consulted and the patient underwent an ERCP.  His drain output dramatically when down following his ERCP.  He otherwise was doing much better.  He was tolerating a diet, moving his bowels, voiding, and had good pain control.  He was instructed  on drain education.  He was otherwise stable for DC home on POD 3.  Physical Exam: Gen: NAD Abd: soft, appropriately tender, drain with about 10cc of bilious output, 25cc documented so far today down from 165 yesterday.  Allergies as of 07/19/2022   No Known Allergies      Medication List     TAKE these medications    acetaminophen 500 MG tablet Commonly known as: TYLENOL Take 2 tablets (1,000 mg total) by mouth every 6 (six) hours as needed.   methocarbamol 500 MG tablet Commonly known as: ROBAXIN Take 1 tablet (500 mg total) by mouth every 6 (six) hours as needed for muscle spasms.   mupirocin ointment 2 % Commonly known as: BACTROBAN Apply 1 Application topically 2 (two) times daily.   oxyCODONE 5 MG immediate release tablet Commonly known as: Oxy IR/ROXICODONE Take 1 tablet (5 mg total) by mouth every 4 (four) hours as needed for severe pain.   pantoprazole 40 MG tablet Commonly known as: PROTONIX TAKE 1 TABLET BY MOUTH EVERY DAY          Follow-up Information     Dwan Bolt, MD Follow up on 07/26/2022.   Specialty: General Surgery Why: 9:30 am, Arrive 30 minutes prior to your appointment time, Please bring your insurance card and photo ID Contact information: [place] [address] [phone]         Carol Ada, MD. Schedule an appointment as soon as possible for a visit in 1 month(s).   Specialty: Gastroenterology Contact information: [address], [place]  [place], [facility] 07/19/2022, 8:42 AM Please see Amion for pager number during day hours 7:00am-4:30pm, 7-11:30am on Weekends

## 2022-07-19 NOTE — Plan of Care (Signed)
  Problem: Education: Goal: Knowledge of General Education information will improve Description: Including pain rating scale, medication(s)/side effects and non-pharmacologic comfort measures Outcome: Completed/Met   Problem: Health Behavior/Discharge Planning: Goal: Ability to manage health-related needs will improve Outcome: Completed/Met   Problem: Clinical Measurements: Goal: Ability to maintain clinical measurements within normal limits will improve Outcome: Completed/Met Goal: Will remain free from infection Outcome: Completed/Met Goal: Diagnostic test results will improve Outcome: Completed/Met Goal: Respiratory complications will improve Outcome: Completed/Met Goal: Cardiovascular complication will be avoided Outcome: Completed/Met   Problem: Nutrition: Goal: Adequate nutrition will be maintained Outcome: Completed/Met   Problem: Coping: Goal: Level of anxiety will decrease Outcome: Completed/Met   Problem: Elimination: Goal: Will not experience complications related to bowel motility Outcome: Completed/Met Goal: Will not experience complications related to urinary retention Outcome: Completed/Met   Problem: Pain Managment: Goal: General experience of comfort will improve Outcome: Completed/Met   Problem: Pain Managment: Goal: General experience of comfort will improve Outcome: Completed/Met   Problem: Safety: Goal: Ability to remain free from injury will improve Outcome: Completed/Met

## 2022-08-21 ENCOUNTER — Other Ambulatory Visit: Payer: Self-pay | Admitting: Gastroenterology

## 2022-08-22 IMAGING — DX DG CHEST 2V
2 series · 2 of 2 positions shown · non-contrast
Comparison: September 10, 2007

CLINICAL DATA: Left pleural effusion.

EXAM:
CHEST - 2 VIEW

[chest pa]
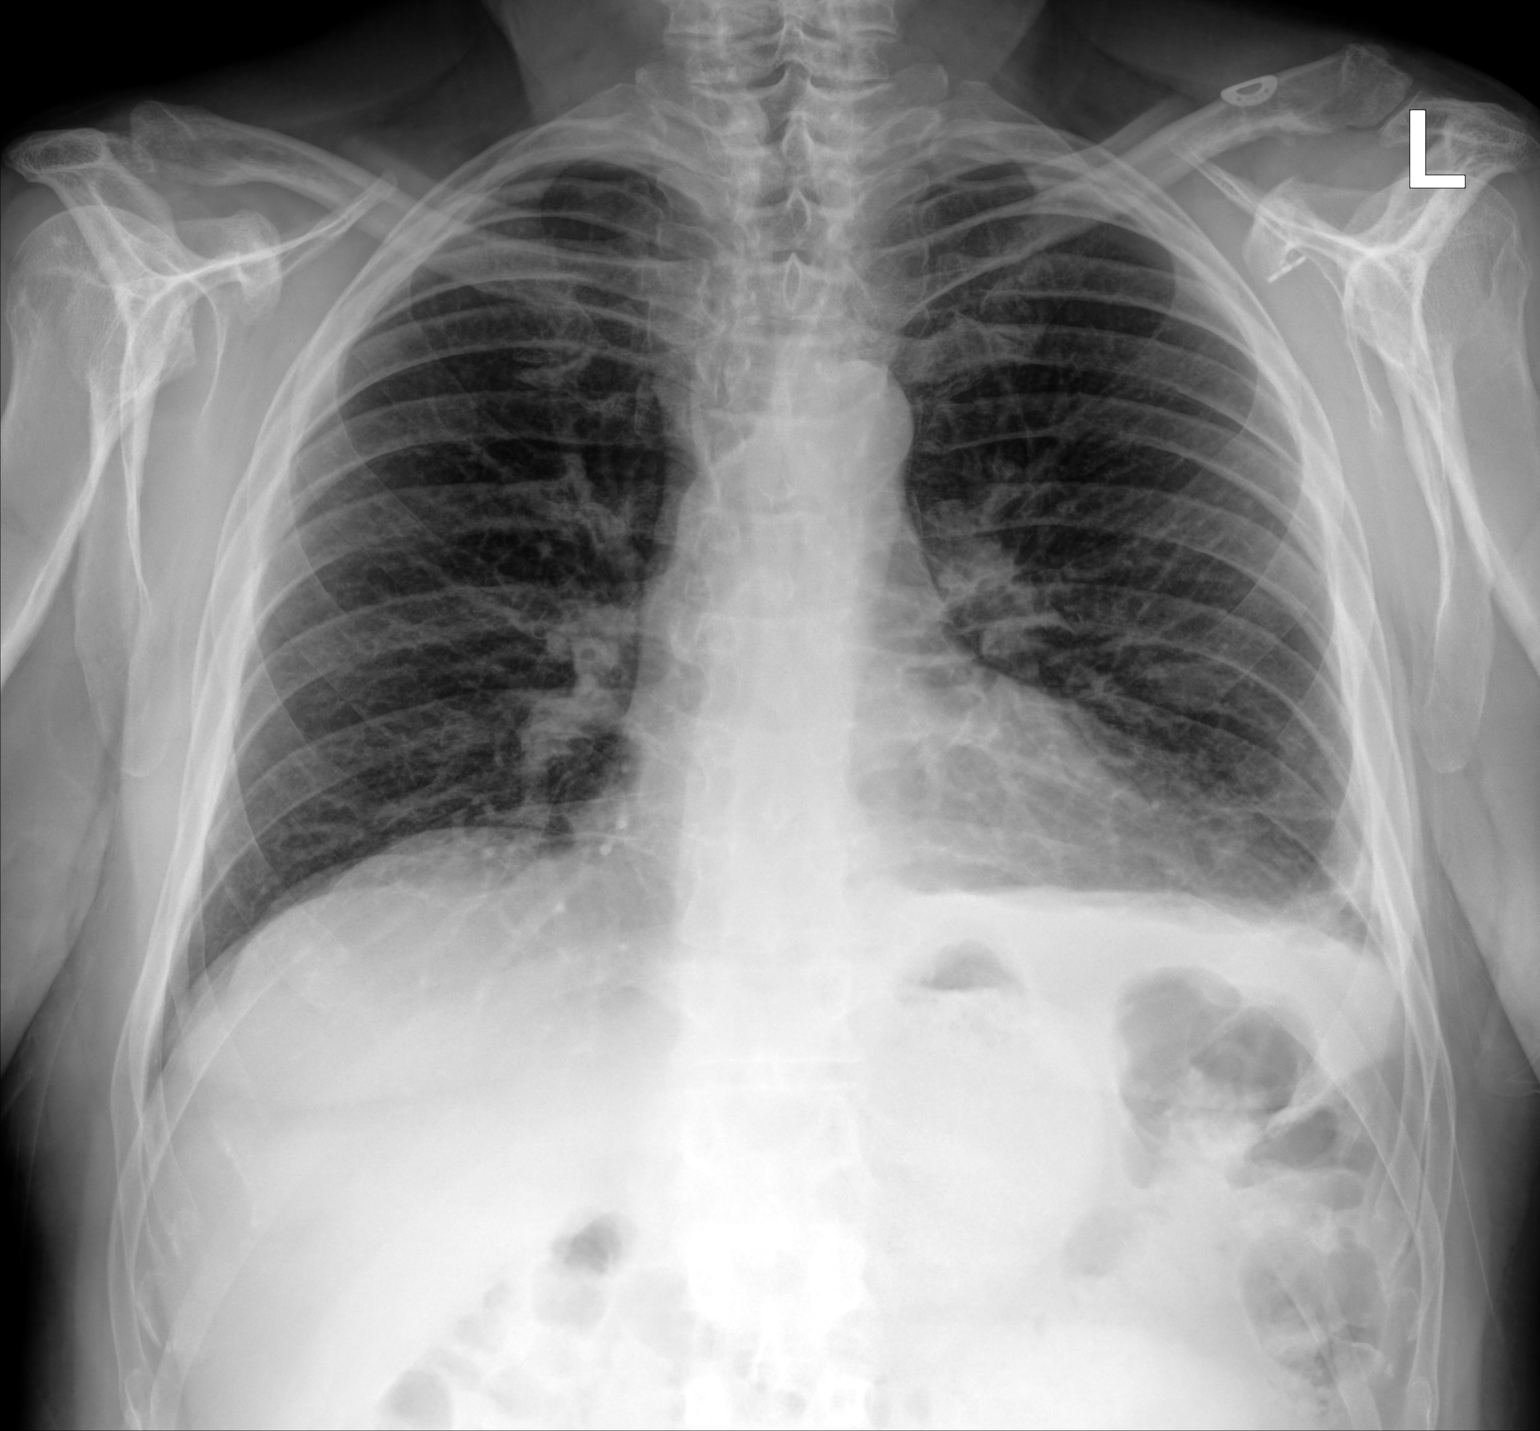

[chest lat]
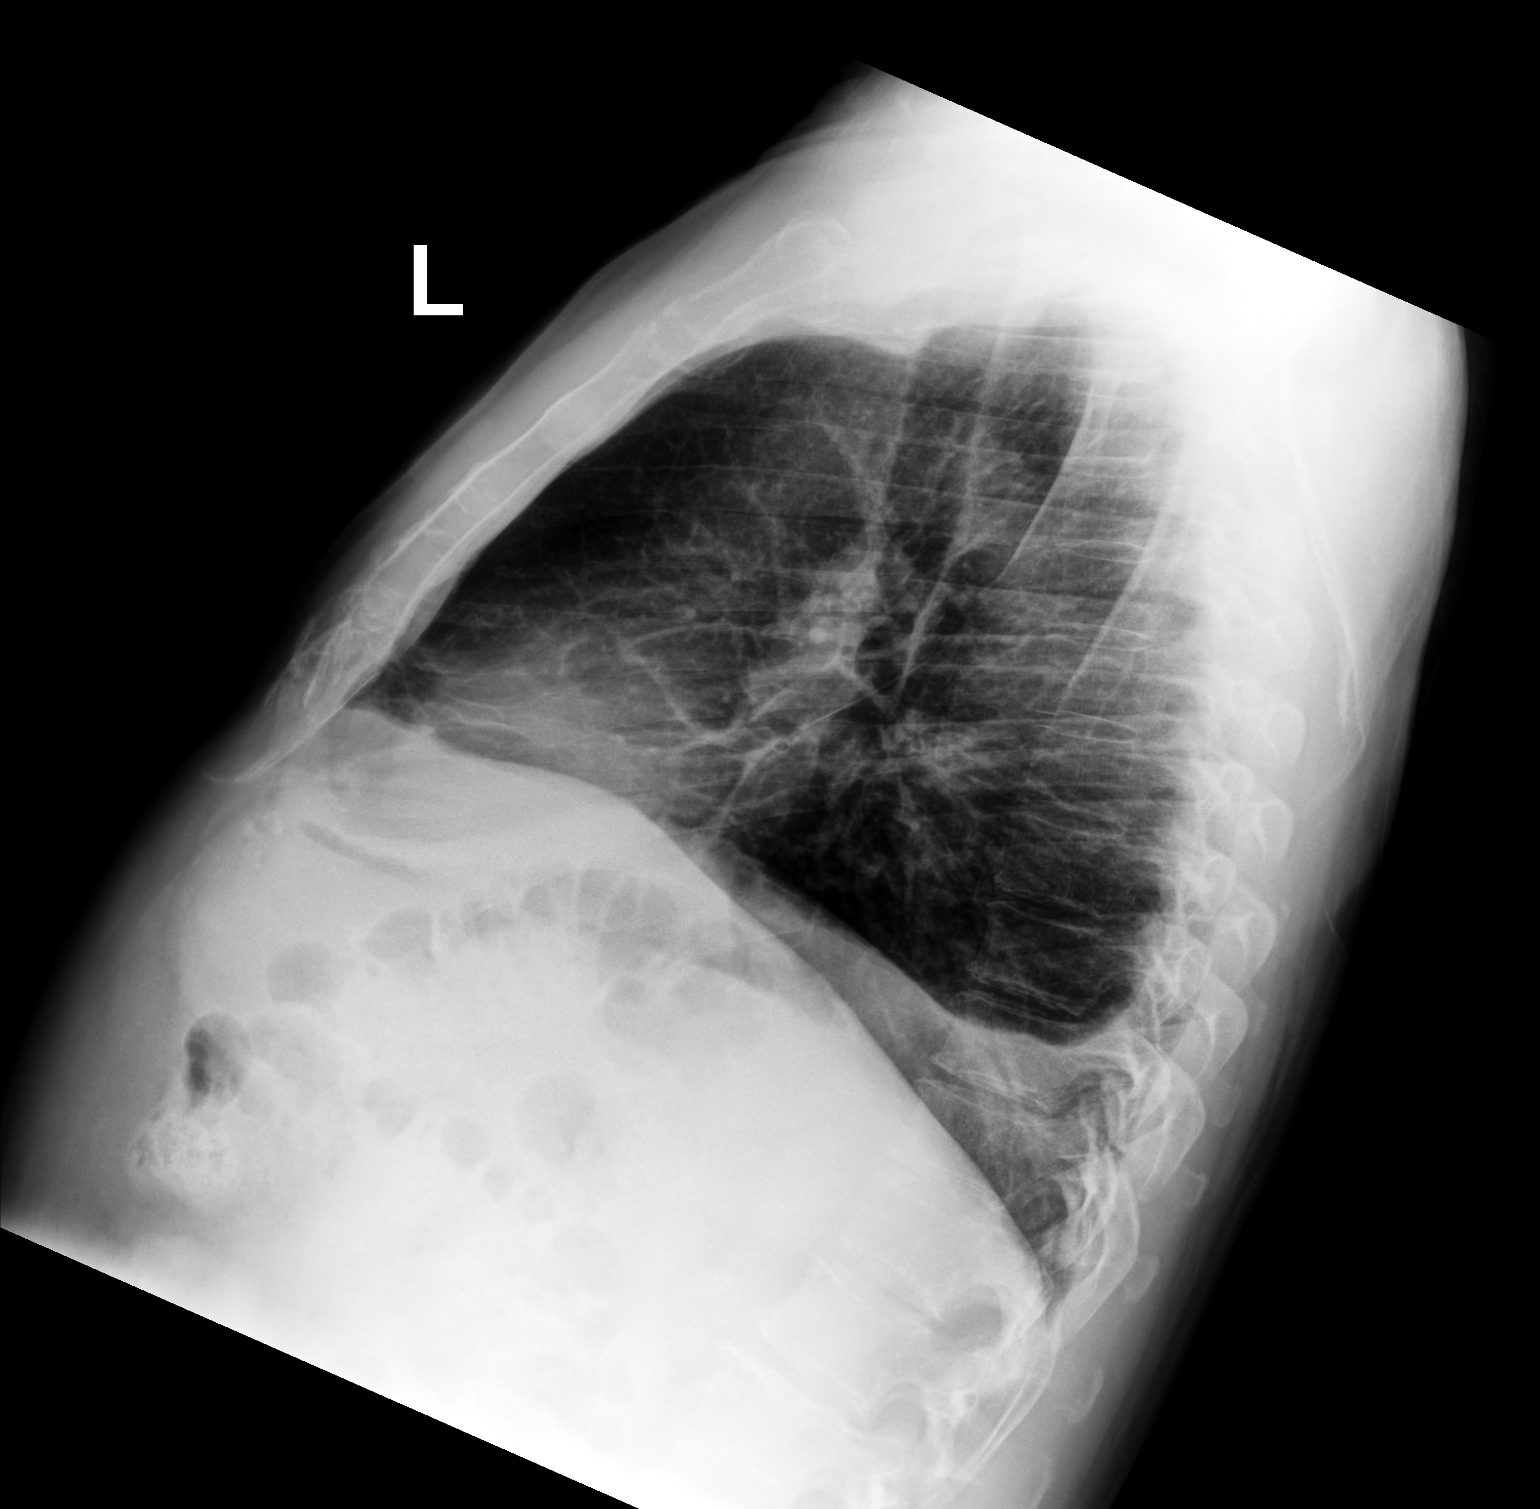

[2 of 2 positions shown; findings below may reference images not displayed]

FINDINGS: There is a small left pleural effusion with underlying opacity.
There is mild opacity in the lingula. The heart, hila, and
mediastinum are normal. No pneumothorax. No other acute
abnormalities.
IMPRESSION: 1. There is a small left pleural effusion with associated
atelectasis.
2. Mild opacity in the lingula may represent atelectasis or
infiltrate. Recommend short-term follow-up imaging to ensure
resolution.

## 2022-09-04 IMAGING — DX DG CHEST 2V
2 series · 2 of 2 positions shown · non-contrast
Comparison: 11/08/2021

CLINICAL DATA: Follow-up pleural effusion

EXAM:
CHEST - 2 VIEW

[chest pa]
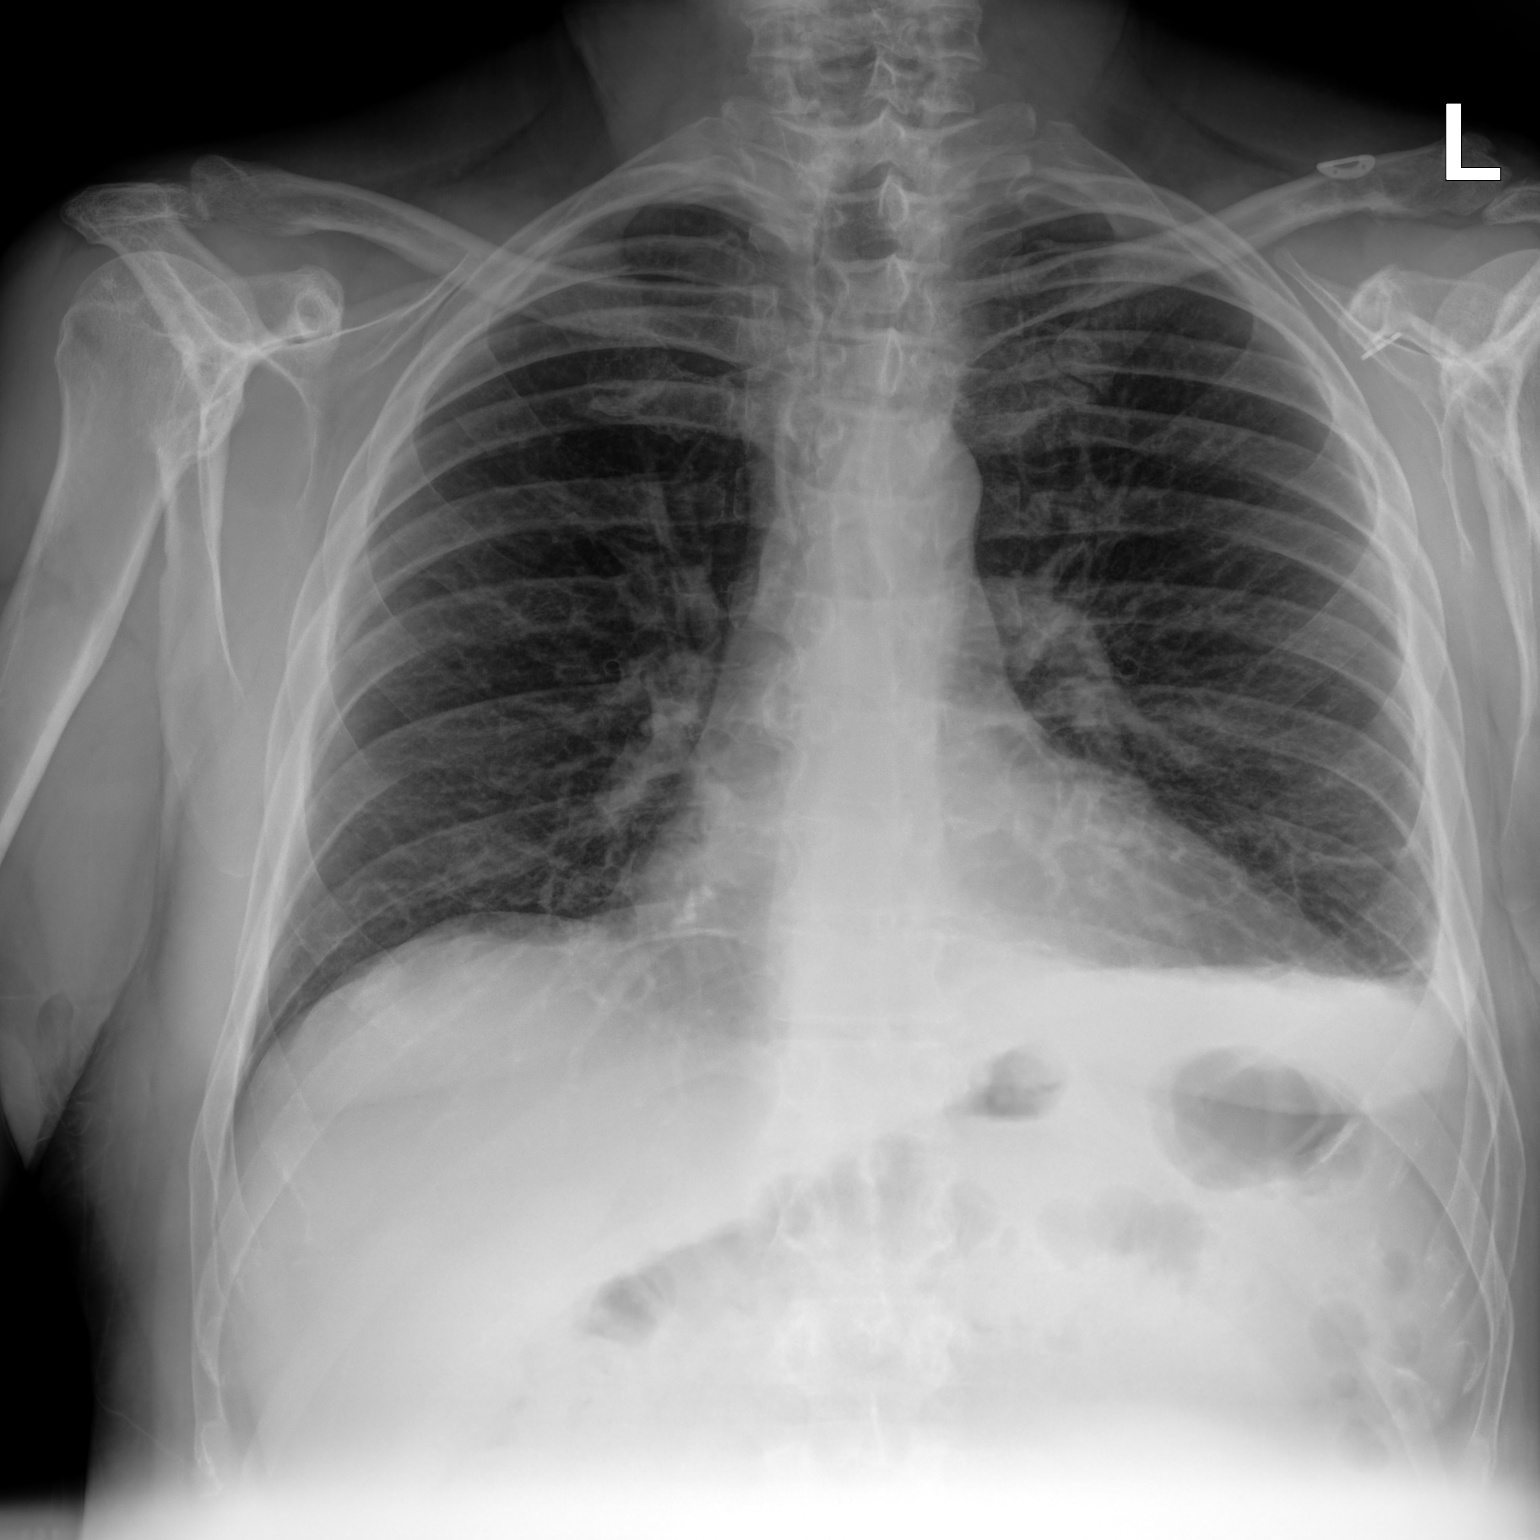

[chest lat]
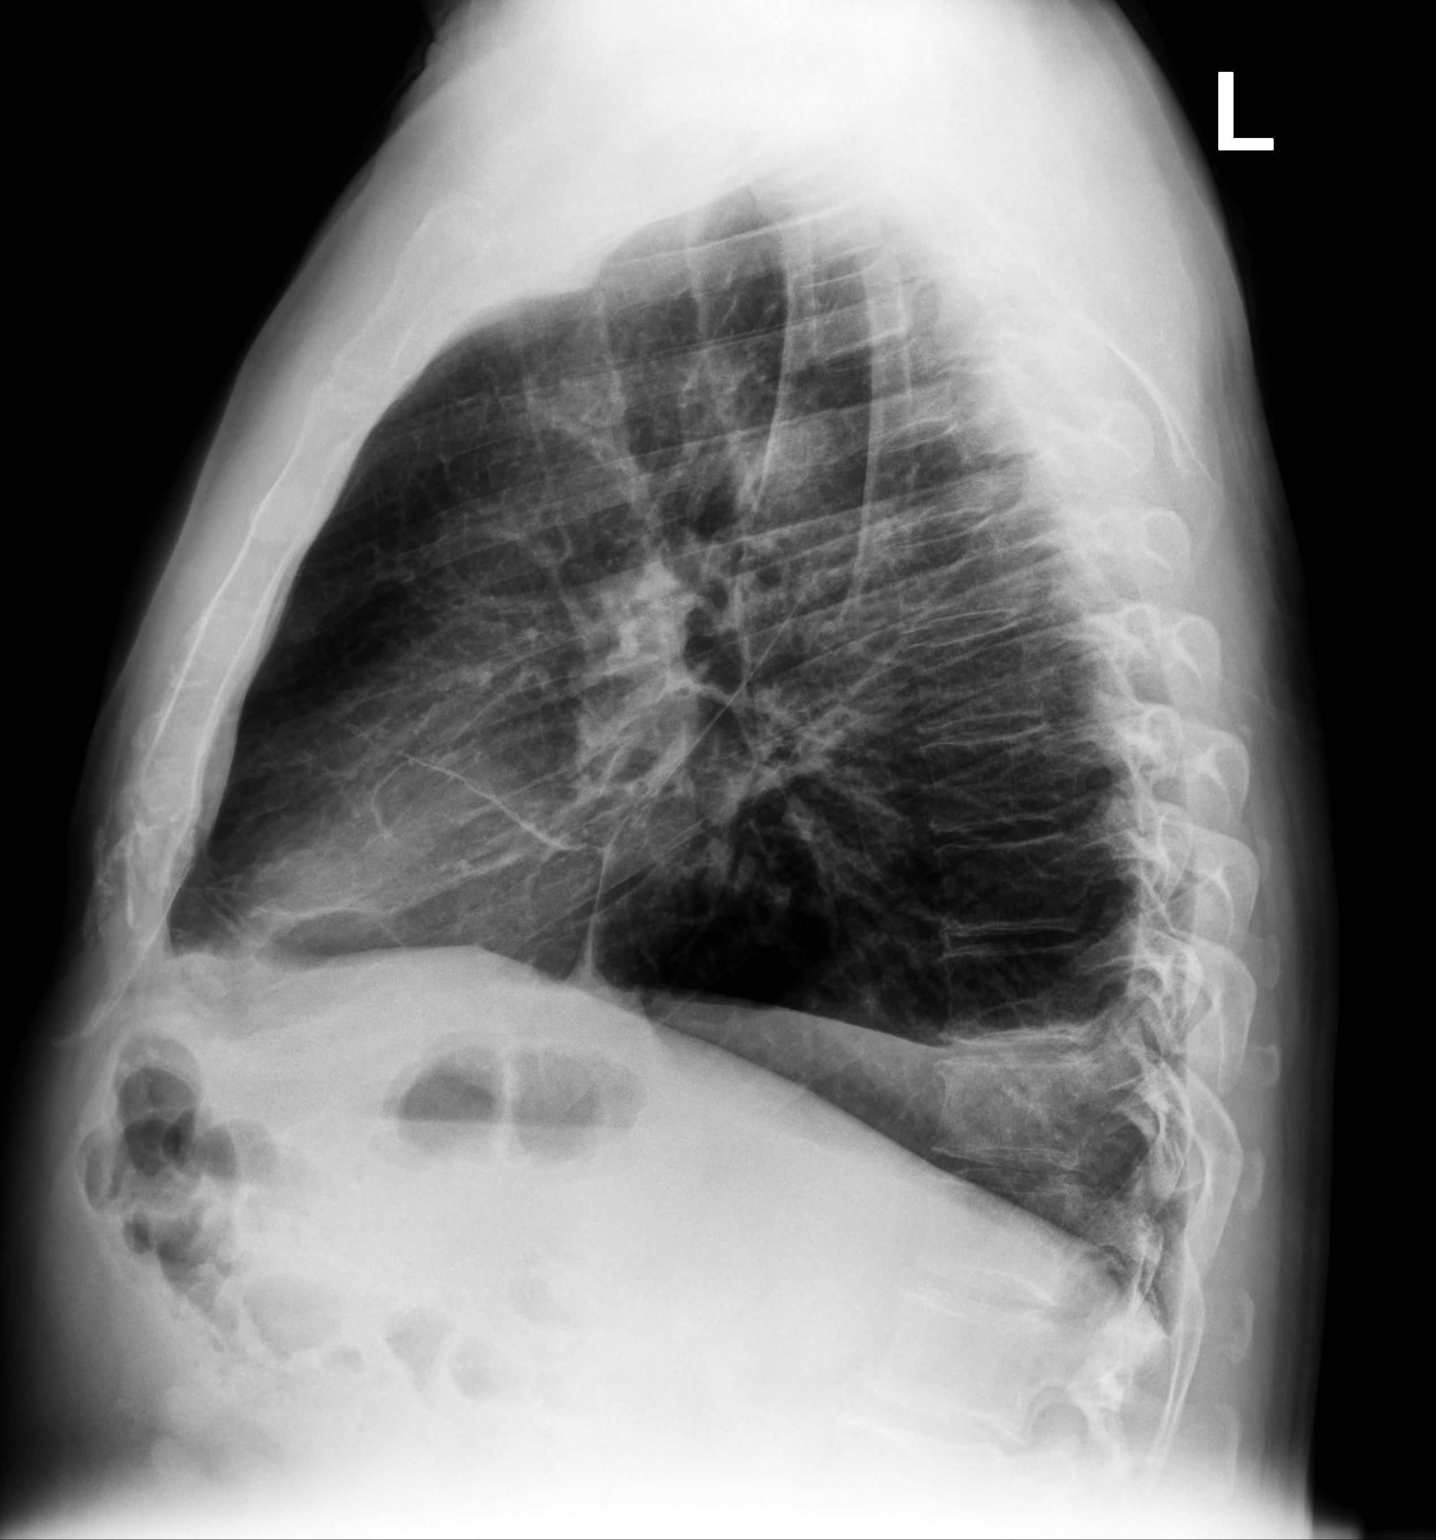

[2 of 2 positions shown; findings below may reference images not displayed]

FINDINGS: The heart size and mediastinal contours are within normal limits.
Unchanged small left pleural effusion and/or pleural thickening with
associated scarring or atelectasis. The visualized skeletal
structures are unremarkable.
IMPRESSION: Unchanged small left pleural effusion and/or pleural thickening with
associated scarring or atelectasis. No new airspace opacity.

## 2022-11-10 ENCOUNTER — Encounter (HOSPITAL_COMMUNITY): Payer: Self-pay | Admitting: Gastroenterology

## 2022-11-16 NOTE — Anesthesia Preprocedure Evaluation (Addendum)
Anesthesia Evaluation  Patient identified by MRN, date of birth, ID band Patient awake    Reviewed: Allergy & Precautions, H&P , NPO status , Patient's Chart, lab work & pertinent test results  Airway Mallampati: II  TM Distance: >3 FB Neck ROM: Full    Dental no notable dental hx. (+) Teeth Intact, Dental Advisory Given   Pulmonary neg pulmonary ROS   Pulmonary exam normal breath sounds clear to auscultation       Cardiovascular negative cardio ROS Normal cardiovascular exam Rhythm:Regular Rate:Normal     Neuro/Psych negative neurological ROS  negative psych ROS   GI/Hepatic Neg liver ROS,GERD  Medicated,,  Endo/Other  negative endocrine ROS    Renal/GU negative Renal ROS  negative genitourinary   Musculoskeletal negative musculoskeletal ROS (+)    Abdominal   Peds negative pediatric ROS (+)  Hematology negative hematology ROS (+)   Anesthesia Other Findings   Reproductive/Obstetrics negative OB ROS                             Anesthesia Physical Anesthesia Plan  ASA: 2  Anesthesia Plan: General   Post-op Pain Management: Minimal or no pain anticipated, Toradol IV (intra-op)* and Ofirmev IV (intra-op)*   Induction: Intravenous  PONV Risk Score and Plan: 2 and Midazolam, Dexamethasone and Ondansetron  Airway Management Planned: Oral ETT  Additional Equipment: None  Intra-op Plan:   Post-operative Plan: Extubation in OR  Informed Consent: I have reviewed the patients History and Physical, chart, labs and discussed the procedure including the risks, benefits and alternatives for the proposed anesthesia with the patient or authorized representative who has indicated his/her understanding and acceptance.     Dental advisory given  Plan Discussed with: CRNA and Anesthesiologist  Anesthesia Plan Comments:        Anesthesia Quick Evaluation

## 2022-11-17 ENCOUNTER — Ambulatory Visit (HOSPITAL_COMMUNITY): Payer: 59 | Admitting: Anesthesiology

## 2022-11-17 ENCOUNTER — Encounter (HOSPITAL_COMMUNITY): Payer: Self-pay | Admitting: Gastroenterology

## 2022-11-17 ENCOUNTER — Ambulatory Visit (HOSPITAL_BASED_OUTPATIENT_CLINIC_OR_DEPARTMENT_OTHER): Payer: 59 | Admitting: Anesthesiology

## 2022-11-17 ENCOUNTER — Other Ambulatory Visit: Payer: Self-pay

## 2022-11-17 ENCOUNTER — Ambulatory Visit (HOSPITAL_COMMUNITY)
Admission: RE | Admit: 2022-11-17 | Discharge: 2022-11-17 | Disposition: A | Discharge status: Home / Self Care | Payer: 59 | Source: Ambulatory Visit | Attending: Gastroenterology | Admitting: Gastroenterology

## 2022-11-17 ENCOUNTER — Encounter (HOSPITAL_COMMUNITY)
Admission: RE | Disposition: A | Discharge status: Home / Self Care | Payer: Self-pay | Source: Ambulatory Visit | Attending: Gastroenterology

## 2022-11-17 DIAGNOSIS — Z4589 Encounter for adjustment and management of other implanted devices: Secondary | ICD-10-CM | POA: Diagnosis present

## 2022-11-17 DIAGNOSIS — Z4659 Encounter for fitting and adjustment of other gastrointestinal appliance and device: Secondary | ICD-10-CM | POA: Diagnosis not present

## 2022-11-17 HISTORY — PX: ENDOSCOPIC RETROGRADE CHOLANGIOPANCREATOGRAPHY (ERCP) WITH PROPOFOL: SHX5810

## 2022-11-17 HISTORY — PX: STENT REMOVAL: SHX6421

## 2022-11-17 SURGERY — ENDOSCOPIC RETROGRADE CHOLANGIOPANCREATOGRAPHY (ERCP) WITH PROPOFOL
Anesthesia: General

## 2022-11-17 MED ORDER — CIPROFLOXACIN IN D5W 400 MG/200ML IV SOLN
INTRAVENOUS | Status: AC
  Filled 2022-11-17: qty 200

## 2022-11-17 MED ORDER — LACTATED RINGERS IV SOLN
INTRAVENOUS | Status: AC | PRN
  Administered 2022-11-17: 1000 mL via INTRAVENOUS

## 2022-11-17 MED ORDER — LIDOCAINE 2% (20 MG/ML) 5 ML SYRINGE
INTRAMUSCULAR | Status: DC | PRN
  Administered 2022-11-17: 80 mg via INTRAVENOUS

## 2022-11-17 MED ORDER — SUGAMMADEX SODIUM 200 MG/2ML IV SOLN
INTRAVENOUS | Status: DC | PRN
  Administered 2022-11-17: 200 mg via INTRAVENOUS

## 2022-11-17 MED ORDER — PROPOFOL 10 MG/ML IV BOLUS
INTRAVENOUS | Status: AC
  Filled 2022-11-17: qty 20

## 2022-11-17 MED ORDER — GLUCAGON HCL RDNA (DIAGNOSTIC) 1 MG IJ SOLR
INTRAMUSCULAR | Status: AC
  Filled 2022-11-17: qty 1

## 2022-11-17 MED ORDER — MIDAZOLAM HCL 2 MG/2ML IJ SOLN
INTRAMUSCULAR | Status: DC | PRN
  Administered 2022-11-17: 2 mg via INTRAVENOUS

## 2022-11-17 MED ORDER — SODIUM CHLORIDE 0.9 % IV SOLN
INTRAVENOUS | Status: DC | PRN
  Administered 2022-11-17: 10 mL

## 2022-11-17 MED ORDER — DICLOFENAC SUPPOSITORY 100 MG
RECTAL | Status: AC
  Filled 2022-11-17: qty 1

## 2022-11-17 MED ORDER — FENTANYL CITRATE (PF) 250 MCG/5ML IJ SOLN
INTRAMUSCULAR | Status: AC
  Filled 2022-11-17: qty 5

## 2022-11-17 MED ORDER — SODIUM CHLORIDE 0.9 % IV SOLN: INTRAVENOUS | Status: DC

## 2022-11-17 MED ORDER — PROPOFOL 10 MG/ML IV BOLUS
INTRAVENOUS | Status: DC | PRN
  Administered 2022-11-17: 150 mg via INTRAVENOUS

## 2022-11-17 MED ORDER — MIDAZOLAM HCL 2 MG/2ML IJ SOLN
INTRAMUSCULAR | Status: AC
  Filled 2022-11-17: qty 2

## 2022-11-17 MED ORDER — DEXAMETHASONE SODIUM PHOSPHATE 10 MG/ML IJ SOLN
INTRAMUSCULAR | Status: DC | PRN
  Administered 2022-11-17: 5 mg via INTRAVENOUS

## 2022-11-17 MED ORDER — ONDANSETRON HCL 4 MG/2ML IJ SOLN
INTRAMUSCULAR | Status: DC | PRN
  Administered 2022-11-17: 4 mg via INTRAVENOUS

## 2022-11-17 MED ORDER — ROCURONIUM BROMIDE 10 MG/ML (PF) SYRINGE
PREFILLED_SYRINGE | INTRAVENOUS | Status: DC | PRN
  Administered 2022-11-17: 60 mg via INTRAVENOUS

## 2022-11-17 MED ORDER — FENTANYL CITRATE (PF) 250 MCG/5ML IJ SOLN
INTRAMUSCULAR | Status: DC | PRN
  Administered 2022-11-17: 100 ug via INTRAVENOUS
  Administered 2022-11-17: 50 ug via INTRAVENOUS

## 2022-11-17 NOTE — Transfer of Care (Signed)
Immediate Anesthesia Transfer of Care Note  Patient: Dakota [name]  Procedure(s) Performed: ENDOSCOPIC RETROGRADE CHOLANGIOPANCREATOGRAPHY (ERCP) WITH PROPOFOL STENT REMOVAL  Patient Location: PACU and Endoscopy Unit  Anesthesia Type:General  Level of Consciousness: awake, drowsy, and responds to stimulation  Airway & Oxygen Therapy: Patient Spontanous Breathing and Patient connected to nasal cannula oxygen  Post-op Assessment: Report given to RN, Post -op Vital signs reviewed and stable, and BP 144/79   Pulse 74   Pulse Ox 98%  Post vital signs: Reviewed and stable  Last Vitals:  Vitals Value Taken Time  BP    Temp    Pulse    Resp    SpO2      Last Pain:  Vitals:   11/17/22 0981  TempSrc: Tympanic  PainSc: 0-No pain         Complications: No notable events documented.

## 2022-11-17 NOTE — H&P (Signed)
Dakota [name] HPI: The patient is here for biliary stent removal.  He had a stent placed for a bile leak in 07/2022.  Since that time he reports feeling well.  No complaints.  Past Medical History:  Diagnosis Date   GERD (gastroesophageal reflux disease)     Past Surgical History:  Procedure Laterality Date   APPENDECTOMY     BILIARY STENT PLACEMENT  07/18/2022   Procedure: BILIARY STENT PLACEMENT;  Surgeon: Dakota Hawking, MD;  Location: [facility] ENDOSCOPY;  Service: Gastroenterology;;   CHOLECYSTECTOMY N/A 07/16/2022   Procedure: LAPAROSCOPIC CHOLECYSTECTOMY;  Surgeon: Dakota Mandes, MD;  Location: [facility] OR;  Service: General;  Laterality: N/A;   ERCP N/A 07/18/2022   Procedure: ENDOSCOPIC RETROGRADE CHOLANGIOPANCREATOGRAPHY (ERCP);  Surgeon: Dakota Hawking, MD;  Location: [facility] ENDOSCOPY;  Service: Gastroenterology;  Laterality: N/A;   finger reattachment Left    SHOULDER ARTHROSCOPY Left    SPHINCTEROTOMY  07/18/2022   Procedure: SPHINCTEROTOMY;  Surgeon: Dakota Hawking, MD;  Location: [facility] ENDOSCOPY;  Service: Gastroenterology;;    History reviewed. No pertinent family history.  Social History:  reports that he has never smoked. He has never used smokeless tobacco. He reports current alcohol use. He reports that he does not use drugs.  Allergies: No Known Allergies  Medications: Scheduled: Continuous:  sodium chloride     lactated ringers 1,000 mL (11/17/22 0643)    No results found for this or any previous visit (from the past 24 hour(s)).   No results found.  ROS:  As stated above in the HPI otherwise negative.  Blood pressure 133/74, pulse 65, temperature 97.7 F (36.5 C), temperature source Tympanic, resp. rate 10, height 5\' 11"  (1.803 m), weight 95.3 kg, SpO2 98 %.    PE: Gen: NAD, Alert and Oriented HEENT:  [place]/AT, EOMI Neck: Supple, no LAD Lungs: CTA Bilaterally CV: RRR without M/G/R ABD: Soft, NTND, +BS Ext: No C/C/E  Assessment/Plan: 1) S/p Bile leak with stenting -  ERCP with stent removal.  Dakota [name] 11/17/2022, 7:21 AM

## 2022-11-17 NOTE — Anesthesia Procedure Notes (Signed)
Procedure Name: Intubation Date/Time: 11/17/2022 7:35 AM  Performed by: Elyn Peers, CRNAPre-anesthesia Checklist: Patient identified, Emergency Drugs available, Suction available, Patient being monitored and Timeout performed Patient Re-evaluated:Patient Re-evaluated prior to induction Oxygen Delivery Method: Circle system utilized Preoxygenation: Pre-oxygenation with 100% oxygen Induction Type: IV induction Ventilation: Mask ventilation without difficulty Laryngoscope Size: Miller and 3 Grade View: Grade I Tube type: Oral Tube size: 8.0 mm Number of attempts: 1 Airway Equipment and Method: Stylet Placement Confirmation: ETT inserted through vocal cords under direct vision, positive ETCO2 and breath sounds checked- equal and bilateral Secured at: 22 cm Tube secured with: Tape Dental Injury: Teeth and Oropharynx as per pre-operative assessment

## 2022-11-17 NOTE — Op Note (Signed)
[facility] Patient Name: Dakota [name] Procedure Date: 11/17/2022 MRN: [id number] Attending MD: Jeani Hawking , MD, [id number] Date of Birth: 1965/10/04 CSN: [id number] Age: 57 Admit Type: Outpatient Procedure:                ERCP Indications:              Biliary stent removal Providers:                Jeani Hawking, MD, Janae Sauce. Huizinga Berg, RN, Irene Shipper,                            Technician Referring MD:              Medicines:                General Anesthesia Complications:            No immediate complications. Estimated Blood Loss:     Estimated blood loss: none. Procedure:                Pre-Anesthesia Assessment:                           - Prior to the procedure, a History and Physical                            was performed, and patient medications and                            allergies were reviewed. The patient's tolerance of                            previous anesthesia was also reviewed. The risks                            and benefits of the procedure and the sedation                            options and risks were discussed with the patient.                            All questions were answered, and informed consent                            was obtained. Prior Anticoagulants: The patient has                            taken no anticoagulant or antiplatelet agents. ASA                            Grade Assessment: II - A patient with mild systemic                            disease. After reviewing the risks and benefits,  the patient was deemed in satisfactory condition to                            undergo the procedure.                           - Sedation was administered by an anesthesia                            professional. General anesthesia was attained.                           After obtaining informed consent, the scope was                            passed under direct vision. Throughout the                             procedure, the patient's blood pressure, pulse, and                            oxygen saturations were monitored continuously. The                            [id number]-[id number] ([id number] Olympus duodenoscope was                            introduced through the mouth, and used to inject                            contrast into and used to inject contrast into the                            bile duct. The ERCP was technically difficult and                            complex. The patient tolerated the procedure well. Scope In: Scope Out: Findings:      A biliary stent was visible on the scout film. One plastic stent       originating in the biliary tree was emerging from the major papilla. The       stent was visibly patent. The major papilla was located entirely within       a diverticulum. The bile duct was deeply cannulated with the short-nosed       traction sphincterotome. Contrast was injected. I personally interpreted       the bile duct images. There was brisk flow of contrast through the       ducts. Image quality was excellent. Contrast extended to the entire       biliary tree. The main bile duct and left and right hepatic ducts and       all intrahepatic branches were normal. A short 0.035 inch Soft Jagwire       was passed into the biliary tree. One stent was removed from the common       bile duct using a snare.  The ERCP was technically difficult to perform as the ampulla was within       the diverticulum and the biliary stent was nearly inserted into the CBD.       Wire cannulation was performed and the sphincterotome was advanced. The       guidewire was secured in the right intrahepatic duct. In an attempt to       remove the stent with a rat-toothed forceps, releasing the elevated       resulted in a loss of the guidwire out of the biliary tract. Cannulation       was then performed again. The elevator was held in place and modulated       to allow for a snare to  be deployed as well as maintaining wire       positioning. The stent was pulled through the scope without       complications. A balloon occlusion cholangiogram was performed and there       was no evidence of any leaks. There was excellent opacification of the       entire biliary tract. Impression:               - One visibly patent stent from the biliary tree                            was seen in the major papilla.                           - The major papilla was located entirely within a                            diverticulum.                           - One stent was removed from the common bile duct. Moderate Sedation:      Not Applicable - Patient had care per Anesthesia. Recommendation:           - Patient has a contact number available for                            emergencies. The signs and symptoms of potential                            delayed complications were discussed with the                            patient. Return to normal activities tomorrow.                            Written discharge instructions were provided to the                            patient.                           - Resume regular diet.                           -  Continue present medications.                           - Return to GI clinic PRN. Procedure Code(s):        --- Professional ---                           [phone], Endoscopic retrograde                            cholangiopancreatography (ERCP); with removal of                            foreign body(s) or stent(s) from biliary/pancreatic                            duct(s)                           [phone], Endoscopic catheterization of the biliary                            ductal system, radiological supervision and                            interpretation Diagnosis Code(s):        --- Professional ---                           A21.30, Encounter for fitting and adjustment of                            other gastrointestinal  appliance and device                           Z96.89, Presence of other specified functional                            implants CPT copyright 2022 [employer]. All rights reserved. The codes documented in this report are preliminary and upon coder review may  be revised to meet current compliance requirements. Jeani Hawking, MD Jeani Hawking, MD 11/17/2022 8:33:19 AM This report has been signed electronically. Number of Addenda: 0

## 2022-11-17 NOTE — Anesthesia Postprocedure Evaluation (Signed)
Anesthesia Post Note  Patient: LAI VENUS  Procedure(s) Performed: ENDOSCOPIC RETROGRADE CHOLANGIOPANCREATOGRAPHY (ERCP) WITH PROPOFOL STENT REMOVAL     Patient location during evaluation: PACU Anesthesia Type: General Level of consciousness: awake and alert Pain management: pain level controlled Vital Signs Assessment: post-procedure vital signs reviewed and stable Respiratory status: spontaneous breathing, nonlabored ventilation, respiratory function stable and patient connected to nasal cannula oxygen Cardiovascular status: blood pressure returned to baseline and stable Postop Assessment: no apparent nausea or vomiting Anesthetic complications: no   No notable events documented.  Last Vitals:  Vitals:   11/17/22 0840 11/17/22 0850  BP: 117/71 123/75  Pulse: 86 82  Resp: (!) 21 12  Temp:    SpO2: 95% 95%    Last Pain:  Vitals:   11/17/22 0850  TempSrc:   PainSc: 0-No pain                 Shunna Mikaelian

## 2022-11-21 ENCOUNTER — Encounter (HOSPITAL_COMMUNITY): Payer: Self-pay | Admitting: Gastroenterology

## 2024-05-27 ENCOUNTER — Ambulatory Visit (HOSPITAL_COMMUNITY)
Admission: EM | Admit: 2024-05-27 | Discharge: 2024-05-27 | Disposition: A | Discharge status: Home / Self Care | Attending: Emergency Medicine | Admitting: Emergency Medicine

## 2024-05-27 ENCOUNTER — Other Ambulatory Visit: Payer: Self-pay

## 2024-05-27 ENCOUNTER — Encounter (HOSPITAL_COMMUNITY): Payer: Self-pay | Admitting: *Deleted

## 2024-05-27 DIAGNOSIS — R10A2 Flank pain, left side: Secondary | ICD-10-CM | POA: Diagnosis not present

## 2024-05-27 LAB — POCT URINALYSIS DIP (MANUAL ENTRY)
Bilirubin, UA: NEGATIVE
Blood, UA: NEGATIVE
Glucose, UA: NEGATIVE mg/dL
Ketones, POC UA: NEGATIVE mg/dL
Leukocytes, UA: NEGATIVE
Nitrite, UA: NEGATIVE
Protein Ur, POC: NEGATIVE mg/dL
Spec Grav, UA: 1.025 (ref 1.010–1.025)
Urobilinogen, UA: 0.2 U/dL
pH, UA: 5.5 (ref 5.0–8.0)

## 2024-05-27 MED ORDER — HYDROCODONE-ACETAMINOPHEN 5-325 MG PO TABS: 1.0000 | ORAL_TABLET | Freq: Four times a day (QID) | ORAL | 0 refills | Status: AC | PRN

## 2024-05-27 MED ORDER — TAMSULOSIN HCL 0.4 MG PO CAPS: 0.4000 mg | ORAL_CAPSULE | Freq: Every day | ORAL | 0 refills | Status: AC

## 2024-05-27 NOTE — ED Triage Notes (Signed)
 PT reports for 2 day she has had LT flank pain that radiates around Lt side. Pt reports having a Hx of renal stones.

## 2024-05-27 NOTE — ED Provider Notes (Signed)
 [facility]    CSN: [id number] Arrival date & time: 05/27/24  1544      History   Chief Complaint Chief Complaint  Patient presents with   Flank Pain    HPI Dakota [name] is a 58 y.o. male.   Patient presents with left flank pain that began 2 days ago.  Patient states the pain does radiate around his left side.  Patient reports a history of kidney stones and states that this pain is the same as before.  Patient denies any pain with urinating, hematuria, urinary frequency/urgency, fever, body aches, chills, nausea, and vomiting.  The history is provided by the patient and medical records.  Flank Pain    Past Medical History:  Diagnosis Date   GERD (gastroesophageal reflux disease)     Patient Active Problem List   Diagnosis Date Noted   Cholecystitis 07/17/2022   Cholecystitis, acute 07/16/2022   Gastroesophageal reflux disease without esophagitis 11/08/2021   Class 1 obesity due to excess calories with body mass index (BMI) of 32.0 to 32.9 in adult 11/08/2021    Past Surgical History:  Procedure Laterality Date   APPENDECTOMY     BILIARY STENT PLACEMENT  07/18/2022   Procedure: BILIARY STENT PLACEMENT;  Surgeon: Rollin Dover, MD;  Location: [facility] ENDOSCOPY;  Service: Gastroenterology;;   CHOLECYSTECTOMY N/A 07/16/2022   Procedure: LAPAROSCOPIC CHOLECYSTECTOMY;  Surgeon: Dasie Leonor CROME, MD;  Location: [facility] OR;  Service: General;  Laterality: N/A;   ENDOSCOPIC RETROGRADE CHOLANGIOPANCREATOGRAPHY (ERCP) WITH PROPOFOL  N/A 11/17/2022   Procedure: ENDOSCOPIC RETROGRADE CHOLANGIOPANCREATOGRAPHY (ERCP) WITH PROPOFOL ;  Surgeon: Rollin Dover, MD;  Location: [facility] ENDOSCOPY;  Service: Gastroenterology;  Laterality: N/A;   ERCP N/A 07/18/2022   Procedure: ENDOSCOPIC RETROGRADE CHOLANGIOPANCREATOGRAPHY (ERCP);  Surgeon: Rollin Dover, MD;  Location[facility] ENDOSCOPY;  Service: Gastroenterology;  Laterality: N/A;   finger reattachment Left    SHOULDER ARTHROSCOPY Left     SPHINCTEROTOMY  07/18/2022   Procedure: SPHINCTEROTOMY;  Surgeon: Rollin Dover, MD;  Location: [facility] ENDOSCOPY;  Service: Gastroenterology;;   CLEDA REMOVAL  11/17/2022   Procedure: STENT REMOVAL;  Surgeon: Rollin Dover, MD;  Location: [facility] ENDOSCOPY;  Service: Gastroenterology;;       Home Medications    Prior to Admission medications   Medication Sig Start Date End Date Taking? Authorizing Provider  HYDROcodone-acetaminophen  (NORCO/VICODIN) 5-325 MG tablet Take 1 tablet by mouth every 6 (six) hours as needed for severe pain (pain score 7-10). 05/27/24  Yes Johnie, Anurag Scarfo A, NP  tamsulosin (FLOMAX) 0.4 MG CAPS capsule Take 1 capsule (0.4 mg total) by mouth daily. 05/27/24  Yes Johnie, Yolande Skoda A, NP  ibuprofen (ADVIL) 200 MG tablet Take 400 mg by mouth every 6 (six) hours as needed for moderate pain.    [provider]  sildenafil (VIAGRA) 100 MG tablet Take 100 mg by mouth as needed for erectile dysfunction. 09/26/22   [provider]    Family History History reviewed. No pertinent family history.  Social History Social History   Tobacco Use   Smoking status: Never   Smokeless tobacco: Never  Substance Use Topics   Alcohol use: Yes    Comment: occ   Drug use: No     Allergies   Patient has no known allergies.   Review of Systems Review of Systems  Genitourinary:  Positive for flank pain.   Per HPI  Physical Exam Triage Vital Signs ED Triage Vitals  Encounter Vitals Group     BP 05/27/24 1725 110/64  Girls Systolic BP Percentile --      Girls Diastolic BP Percentile --      Boys Systolic BP Percentile --      Boys Diastolic BP Percentile --      Pulse Rate 05/27/24 1725 82     Resp 05/27/24 1725 20     Temp 05/27/24 1725 97.9 F (36.6 C)     Temp src --      SpO2 05/27/24 1725 97 %     Weight --      Height --      Head Circumference --      Peak Flow --      Pain Score 05/27/24 1723 8     Pain Loc --      Pain Education --       Exclude from Growth Chart --    No data found.  Updated Vital Signs BP 110/64   Pulse 82   Temp 97.9 F (36.6 C)   Resp 20   SpO2 97%   Visual Acuity Right Eye Distance:   Left Eye Distance:   Bilateral Distance:    Right Eye Near:   Left Eye Near:    Bilateral Near:     Physical Exam Vitals and nursing note reviewed.  Constitutional:      General: He is awake. He is not in acute distress.    Appearance: Normal appearance. He is well-developed and well-groomed. He is not ill-appearing.  Abdominal:     General: Abdomen is flat. Bowel sounds are normal.     Palpations: Abdomen is soft.     Tenderness: There is no abdominal tenderness. There is left CVA tenderness.  Skin:    General: Skin is warm and dry.  Neurological:     Mental Status: He is alert.  Psychiatric:        Behavior: Behavior is cooperative.      UC Treatments / Results  Labs (all labs ordered are listed, but only abnormal results are displayed) Labs Reviewed  POCT URINALYSIS DIP (MANUAL ENTRY) - Abnormal; Notable for the following components:      Result Value   Clarity, UA cloudy (*)    All other components within normal limits    EKG   Radiology No results found.  Procedures Procedures (including critical care time)  Medications Ordered in UC Medications - No data to display  Initial Impression / Assessment and Plan / UC Course  I have reviewed the triage vital signs and the nursing notes.  Pertinent labs & imaging results that were available during my care of the patient were reviewed by me and considered in my medical decision making (see chart for details).     Patient is overall well-appearing.  Vitals are stable.  Left-sided CVA tenderness noted on exam.  Urinalysis unremarkable.  Exam findings consistent with possible kidney stone.  Prescribed Flomax to help pass kidney stone.  Prescribed Norco as needed for severe pain.  Given urology follow-up if needed.  Discussed follow-up,  return, and strict ER precautions. Final Clinical Impressions(s) / UC Diagnoses   Final diagnoses:  Left flank pain     Discharge Instructions      As discussed I do believe your pain is likely related to the presence of a kidney stone. Start taking Flomax once daily to help pass the kidney stone. You can take 1 tablet of Norco every 6 hours as needed for severe pain. Follow-up with [facility] if your symptoms continue  for further evaluation. If you develop worsening pain, inability to urinate, large amounts of blood in your urine, or fever please seek immediate medical treatment in the emergency department.   ED Prescriptions     Medication Sig Dispense Auth. Provider   tamsulosin (FLOMAX) 0.4 MG CAPS capsule Take 1 capsule (0.4 mg total) by mouth daily. 30 capsule Johnie, Nayra Coury A, NP   HYDROcodone-acetaminophen  (NORCO/VICODIN) 5-325 MG tablet Take 1 tablet by mouth every 6 (six) hours as needed for severe pain (pain score 7-10). 10 tablet Johnie Flaming A, NP      I have reviewed the PDMP during this encounter.   Johnie Flaming A, NP 05/27/24 [phone]

## 2024-05-27 NOTE — Discharge Instructions (Signed)
 As discussed I do believe your pain is likely related to the presence of a kidney stone. Start taking Flomax once daily to help pass the kidney stone. You can take 1 tablet of Norco every 6 hours as needed for severe pain. Follow-up with [facility] if your symptoms continue for further evaluation. If you develop worsening pain, inability to urinate, large amounts of blood in your urine, or fever please seek immediate medical treatment in the emergency department.
